# Patient Record
Sex: Female | Born: 1995 | Race: White | Hispanic: No | Smoking: Never smoker
Health system: Southern US, Community
[De-identification: ages and names within clinical notes are randomized; demographics above are authoritative.]

## PROBLEM LIST (undated history)

## (undated) ENCOUNTER — Inpatient Hospital Stay: Payer: Self-pay

## (undated) DIAGNOSIS — Z789 Other specified health status: Secondary | ICD-10-CM

## (undated) DIAGNOSIS — K589 Irritable bowel syndrome without diarrhea: Secondary | ICD-10-CM

## (undated) DIAGNOSIS — N83209 Unspecified ovarian cyst, unspecified side: Secondary | ICD-10-CM

## (undated) DIAGNOSIS — R87629 Unspecified abnormal cytological findings in specimens from vagina: Secondary | ICD-10-CM

## (undated) DIAGNOSIS — T7840XA Allergy, unspecified, initial encounter: Secondary | ICD-10-CM

## (undated) DIAGNOSIS — F32A Depression, unspecified: Secondary | ICD-10-CM

## (undated) DIAGNOSIS — F419 Anxiety disorder, unspecified: Secondary | ICD-10-CM

## (undated) HISTORY — PX: TONSILLECTOMY: SUR1361

## (undated) HISTORY — DX: Unspecified abnormal cytological findings in specimens from vagina: R87.629

## (undated) HISTORY — DX: Unspecified ovarian cyst, unspecified side: N83.209

## (undated) HISTORY — PX: ADENOIDECTOMY W/ MYRINGOTOMY: SHX1128

## (undated) HISTORY — DX: Irritable bowel syndrome, unspecified: K58.9

## (undated) HISTORY — PX: ERCP: SHX60

## (undated) HISTORY — DX: Allergy, unspecified, initial encounter: T78.40XA

## (undated) HISTORY — DX: Anxiety disorder, unspecified: F41.9

## (undated) HISTORY — DX: Other specified health status: Z78.9

## (undated) HISTORY — DX: Depression, unspecified: F32.A

---
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
## 2015-01-28 ENCOUNTER — Encounter: Payer: Self-pay | Admitting: *Deleted

## 2015-01-28 ENCOUNTER — Inpatient Hospital Stay
Admission: EM | Admit: 2015-01-28 | Discharge: 2015-01-28 | Disposition: A | Discharge status: Home / Self Care | Payer: Medicaid Other | Attending: Obstetrics & Gynecology | Admitting: Obstetrics & Gynecology

## 2015-01-28 DIAGNOSIS — O368120 Decreased fetal movements, second trimester, not applicable or unspecified: Secondary | ICD-10-CM | POA: Diagnosis present

## 2015-01-28 DIAGNOSIS — Z3A21 21 weeks gestation of pregnancy: Secondary | ICD-10-CM | POA: Insufficient documentation

## 2015-01-28 NOTE — OB Triage Note (Signed)
21week G2P1 presents with c/o decreased fetal movement

## 2015-01-28 NOTE — Final Progress Note (Signed)
Physician Final Progress Note  Patient ID: Kristen Little MRN: [id number] DOB/AGE: [birth year] 19 y.o.  Admit date: 01/28/2015 Admitting provider: Nadara Mustardobert P Waymon Laser, MD Discharge date: 01/28/2015   Admission Diagnoses: Decreased fetal movement  Discharge Diagnoses:  Active Problems:   * No active hospital problems. *    DFM  Consults: None  Significant Findings/ Diagnostic Studies: FHT 130s.   No s/sx PTL.  Discharge Condition: good  Disposition: 01-Home or Self Care  Diet: Regular diet  Discharge Activity: Activity as tolerated     Medication List    ASK your doctor about these medications        prenatal multivitamin Tabs tablet  Take 1 tablet by mouth daily at 12 noon.         Total time spent taking care of this patient: 15 minutes  Signed: Letitia LibraHARRIS,Neven Fina PAUL 01/28/2015, 5:58 PM

## 2015-01-28 NOTE — Progress Notes (Signed)
Progress Note Decreased Fetal Movement.  Subjective:   Kristen Little is a 19 y.o. female. She is at 6229w2d gestation. She has noted decreased fetal movement for the last 1 day.  She also reports normal pregnancy at [facility] but admits to no bleeding, no contractions, no cramping, no leaking and bleeding, contractions, cramping or leaking. Her pregnancy has been complicated by: none  PMH, PSH, POBH and problem list reviewed Medications and allergies reviewed.    Objective:  AF, VSS General appearance: alert, well appearing, and in no distress. External fetal monitoring: 130s.    Assessment:   Pregnancy at 6229w2d with concerns for decreased fetal movement. Evaluation reveals: Fetal Wellbeing Reassuring     Plan:    Counseled on fetal movements at 21 weeks  Orders placed: Orders Placed This Encounter  Procedures  . Discharge patient    Standing Status: Standing     Number of Occurrences: 1     Standing Expiration Date:     Patient expresses understanding of information provided and plan of care.

## 2015-01-28 NOTE — Discharge Instructions (Signed)
Follow up with the Health Department at your normal appointment.   Call your OB or come into the emergency room if you have any bleeding or leaking of fluid.

## 2015-02-14 NOTE — L&D Delivery Note (Addendum)
Delivery Note At 12:50 PM a viable female was delivered via Vaginal, Spontaneous Delivery (Presentation:OA;LOA).  APGAR: 8, 9; weight  3310 g.   Placenta status: Intact, Spontaneous.  Cord: 3 vessels with the following complications: none.  Cord pH: NA  Called to see patient. Epidural not working well and pt with strong urge to push. AROM for MSF. Lip of cervix easily reduced. Mom pushed to delivery viable female infant.  The head followed by shoulders, which delivered without difficulty, and the rest of the body.  Nuchal cord x1 noted and easily reduced on the perineum.  Baby to mom's chest with vigorous cry.  Cord clamped and cut after > 1 min delay.  No cord blood obtained.  Placenta delivered spontaneously, intact, with a 3-vessel cord.  Second degree perineal laceration repaired with 3-0 Vicryl in standard fashion.  All counts correct.  Hemostasis obtained with IV pitocin and fundal massage. EBL 300 mL.    Anesthesia: Epidural Local  Episiotomy: None Lacerations: 2nd degree Suture Repair: 3.0 vicryl Est. Blood Loss (mL):  300  Mom to postpartum.  Baby to Couplet care / Skin to Skin.  Tresea MallGLEDHILL,Lorry Furber, CNM

## 2015-05-08 ENCOUNTER — Observation Stay
Admission: EM | Admit: 2015-05-08 | Discharge: 2015-05-09 | Disposition: A | Discharge status: Home / Self Care | Payer: Medicaid Other | Attending: Obstetrics and Gynecology | Admitting: Obstetrics and Gynecology

## 2015-05-08 ENCOUNTER — Encounter: Payer: Self-pay | Admitting: *Deleted

## 2015-05-08 DIAGNOSIS — O99513 Diseases of the respiratory system complicating pregnancy, third trimester: Principal | ICD-10-CM | POA: Insufficient documentation

## 2015-05-08 DIAGNOSIS — O9989 Other specified diseases and conditions complicating pregnancy, childbirth and the puerperium: Secondary | ICD-10-CM | POA: Diagnosis present

## 2015-05-08 DIAGNOSIS — Z880 Allergy status to penicillin: Secondary | ICD-10-CM | POA: Insufficient documentation

## 2015-05-08 DIAGNOSIS — J101 Influenza due to other identified influenza virus with other respiratory manifestations: Secondary | ICD-10-CM | POA: Diagnosis not present

## 2015-05-08 DIAGNOSIS — Z881 Allergy status to other antibiotic agents status: Secondary | ICD-10-CM | POA: Insufficient documentation

## 2015-05-08 DIAGNOSIS — Z3A35 35 weeks gestation of pregnancy: Secondary | ICD-10-CM | POA: Diagnosis not present

## 2015-05-08 NOTE — OB Triage Note (Signed)
Notes aches and pain throughout body x 3-4 days, specifically back, under ribs. Notes h/a's on temples x 3 days- Tylenol  Possibly 325 mg only-does not take away. Pt states aches and pains much worse today- started a with a productive cough today with a light yellow phelgm

## 2015-05-09 DIAGNOSIS — J101 Influenza due to other identified influenza virus with other respiratory manifestations: Secondary | ICD-10-CM

## 2015-05-09 DIAGNOSIS — O99513 Diseases of the respiratory system complicating pregnancy, third trimester: Secondary | ICD-10-CM | POA: Diagnosis not present

## 2015-05-09 LAB — INFLUENZA PANEL BY PCR (TYPE A & B)
H1N1 flu by pcr: NOT DETECTED
INFLAPCR: POSITIVE — AB
INFLBPCR: NEGATIVE

## 2015-05-09 MED ORDER — OSELTAMIVIR PHOSPHATE 75 MG PO CAPS
75.0000 mg | ORAL_CAPSULE | ORAL | Status: AC
  Administered 2015-05-09: 75 mg via ORAL
  Filled 2015-05-09: qty 1

## 2015-05-09 MED ORDER — ACETAMINOPHEN 500 MG PO TABS
1000.0000 mg | ORAL_TABLET | ORAL | Status: AC
  Administered 2015-05-09: 1000 mg via ORAL
  Filled 2015-05-09: qty 2

## 2015-05-09 MED ORDER — OSELTAMIVIR PHOSPHATE 75 MG PO CAPS: 75.0000 mg | ORAL_CAPSULE | Freq: Two times a day (BID) | ORAL | Status: AC

## 2015-05-09 NOTE — Progress Notes (Signed)
Nasal swab obtained as ordered, pt tolerated procedure well.

## 2015-05-09 NOTE — Final Progress Note (Signed)
Physician Final Progress Note  Patient ID: Kristen Little C Johnson MRN: [id number] DOB/AGE: March 25, 1995 20 y.o.  Admit date: 05/08/2015 Admitting provider: Conard NovakStephen D Miliyah Luper, MD Discharge date: 05/09/2015   Admission Diagnoses:  Supervision pregnancy 3rd trimester Intrauterine pregnancy at 7366w5d   Discharge Diagnoses:  Active Problems:   Labor and delivery, indication for care  Supervision pregnancy 3rd trimester  pregnancy at 3766w5d  Influenza A   History of Present Illness: The patient is a 20 y.o. female G2P0 at 1766w5d who presents for two-three days of cough, aching, chills, nausea vomiting.  Denies fever and chills. Notes +FM, no LOF, no ctx. No vaginal bleeding   Past Medical HistoryNo past medical history on file.  Past Surgical History  Procedure Laterality Date  . Tonsillectomy    . Adenoidectomy w/ myringotomy Bilateral     done as a child    No current facility-administered medications on file prior to encounter.   Current Outpatient Prescriptions on File Prior to Encounter  Medication Sig Dispense Refill  . Prenatal Vit-Fe Fumarate-FA (PRENATAL MULTIVITAMIN) TABS tablet Take 1 tablet by mouth daily at 12 noon.      Allergies  Allergen Reactions  . Azithromycin Nausea And Vomiting  . Penicillins Other (See Comments)    States she had been noted to have allergy as a child- does not know what reaction was- has avoided taking it    Social History   Social History  . Marital Status: Single    Spouse Name: N/A  . Number of Children: N/A  . Years of Education: N/A   Occupational History  . Not on file.   Social History Main Topics  . Smoking status: Never Smoker   . Smokeless tobacco: Not on file  . Alcohol Use: No  . Drug Use: No  . Sexual Activity: Yes   Other Topics Concern  . Not on file   Social History Narrative    Physical Exam: BP 115/62 mmHg  Pulse 105  Temp(Src) 98.7 F (37.1 C) (Oral)  Resp 20  Ht 5\' 4"  (1.626 m)  Wt 99.791 kg (220 lb)   BMI 37.74 kg/m2  SpO2 97%  Gen: NAD CV: RRR Pulm: CTAB Pelvic: Deferred Ext: no e/c/t  Consults: None  Significant Findings/ Diagnostic Studies:  Influenza A positive  Procedures:  NST Baseline: 120 Variability: moderate Accelerations: present Decelerations; absent Toco: minimal  Discharge Condition: stable  Disposition: 01-Home or Self Care  Diet: Regular diet  Discharge Activity: Activity as tolerated     Medication List    TAKE these medications        aspirin 81 MG tablet  Take 81 mg by mouth daily.     oseltamivir 75 MG capsule  Commonly known as:  TAMIFLU  Take 1 capsule (75 mg total) by mouth 2 (two) times daily.     prenatal multivitamin Tabs tablet  Take 1 tablet by mouth daily at 12 noon.         Total time spent taking care of this patient: 30 minutes  Signed: Conard NovakJackson, Ryer Asato D, MD  05/09/2015, 2:53 AM

## 2015-05-09 NOTE — Progress Notes (Signed)
Pt left department walking with spouse, belongings, daughter, d/c instructions- reviewed, and prescription.

## 2015-06-10 ENCOUNTER — Inpatient Hospital Stay
Admission: EM | Admit: 2015-06-10 | Discharge: 2015-06-12 | DRG: 774 | Disposition: A | Discharge status: Home / Self Care | Payer: Medicaid Other | Attending: Obstetrics & Gynecology | Admitting: Obstetrics & Gynecology

## 2015-06-10 ENCOUNTER — Inpatient Hospital Stay: Payer: Medicaid Other | Admitting: Anesthesiology

## 2015-06-10 ENCOUNTER — Encounter: Payer: Self-pay | Admitting: Anesthesiology

## 2015-06-10 DIAGNOSIS — E669 Obesity, unspecified: Secondary | ICD-10-CM | POA: Diagnosis present

## 2015-06-10 DIAGNOSIS — Z833 Family history of diabetes mellitus: Secondary | ICD-10-CM

## 2015-06-10 DIAGNOSIS — O1092 Unspecified pre-existing hypertension complicating childbirth: Principal | ICD-10-CM | POA: Diagnosis present

## 2015-06-10 DIAGNOSIS — Z3A40 40 weeks gestation of pregnancy: Secondary | ICD-10-CM | POA: Diagnosis not present

## 2015-06-10 DIAGNOSIS — Z6838 Body mass index (BMI) 38.0-38.9, adult: Secondary | ICD-10-CM

## 2015-06-10 DIAGNOSIS — Z809 Family history of malignant neoplasm, unspecified: Secondary | ICD-10-CM

## 2015-06-10 DIAGNOSIS — Z880 Allergy status to penicillin: Secondary | ICD-10-CM | POA: Diagnosis not present

## 2015-06-10 DIAGNOSIS — Z881 Allergy status to other antibiotic agents status: Secondary | ICD-10-CM | POA: Diagnosis not present

## 2015-06-10 DIAGNOSIS — J101 Influenza due to other identified influenza virus with other respiratory manifestations: Secondary | ICD-10-CM

## 2015-06-10 DIAGNOSIS — O99214 Obesity complicating childbirth: Secondary | ICD-10-CM | POA: Diagnosis present

## 2015-06-10 DIAGNOSIS — Z8249 Family history of ischemic heart disease and other diseases of the circulatory system: Secondary | ICD-10-CM

## 2015-06-10 DIAGNOSIS — O480 Post-term pregnancy: Secondary | ICD-10-CM | POA: Diagnosis present

## 2015-06-10 DIAGNOSIS — O99824 Streptococcus B carrier state complicating childbirth: Secondary | ICD-10-CM | POA: Diagnosis present

## 2015-06-10 DIAGNOSIS — O10913 Unspecified pre-existing hypertension complicating pregnancy, third trimester: Secondary | ICD-10-CM | POA: Diagnosis present

## 2015-06-10 LAB — CBC
HEMATOCRIT: 38 % (ref 35.0–47.0)
Hemoglobin: 12.8 g/dL (ref 12.0–16.0)
MCH: 28.8 pg (ref 26.0–34.0)
MCHC: 33.8 g/dL (ref 32.0–36.0)
MCV: 85.3 fL (ref 80.0–100.0)
PLATELETS: 186 10*3/uL (ref 150–440)
RBC: 4.46 MIL/uL (ref 3.80–5.20)
RDW: 15.2 % — AB (ref 11.5–14.5)
WBC: 12.4 10*3/uL — AB (ref 3.6–11.0)

## 2015-06-10 LAB — COMPREHENSIVE METABOLIC PANEL
ALBUMIN: 3.5 g/dL (ref 3.5–5.0)
ALT: 10 U/L — ABNORMAL LOW (ref 14–54)
ANION GAP: 11 (ref 5–15)
AST: 17 U/L (ref 15–41)
Alkaline Phosphatase: 171 U/L — ABNORMAL HIGH (ref 38–126)
BUN: 12 mg/dL (ref 6–20)
CO2: 20 mmol/L — AB (ref 22–32)
Calcium: 9.1 mg/dL (ref 8.9–10.3)
Chloride: 104 mmol/L (ref 101–111)
Creatinine, Ser: 0.72 mg/dL (ref 0.44–1.00)
GFR calc Af Amer: >60 mL/min (ref >60)
GFR calc non Af Amer: >60 mL/min (ref >60)
GLUCOSE: 97 mg/dL (ref 65–99)
POTASSIUM: 4.2 mmol/L (ref 3.5–5.1)
SODIUM: 135 mmol/L (ref 135–145)
Total Bilirubin: 0.6 mg/dL (ref 0.3–1.2)
Total Protein: 7 g/dL (ref 6.5–8.1)

## 2015-06-10 LAB — ABO/RH: ABO/RH(D): B POS

## 2015-06-10 LAB — TYPE AND SCREEN
ABO/RH(D): B POS
ANTIBODY SCREEN: NEGATIVE

## 2015-06-10 MED ORDER — FENTANYL 2.5 MCG/ML W/ROPIVACAINE 0.2% IN NS 100 ML EPIDURAL INFUSION (ARMC-ANES)
10.0000 mL/h | EPIDURAL | Status: DC
  Filled 2015-06-10: qty 100

## 2015-06-10 MED ORDER — SODIUM CHLORIDE 0.9 % IV SOLN
INTRAVENOUS | Status: AC
  Administered 2015-06-10: 1 g via INTRAVENOUS
  Filled 2015-06-10: qty 1000

## 2015-06-10 MED ORDER — EPHEDRINE 5 MG/ML INJ
10.0000 mg | INTRAVENOUS | Status: DC | PRN
  Filled 2015-06-10: qty 2

## 2015-06-10 MED ORDER — BUTORPHANOL TARTRATE 1 MG/ML IJ SOLN
1.0000 mg | INTRAMUSCULAR | Status: DC | PRN
  Administered 2015-06-10 (×2): 1 mg via INTRAVENOUS
  Filled 2015-06-10 (×2): qty 1

## 2015-06-10 MED ORDER — LACTATED RINGERS IV SOLN: 500.0000 mL | INTRAVENOUS | Status: DC | PRN

## 2015-06-10 MED ORDER — TETANUS-DIPHTH-ACELL PERTUSSIS 5-2.5-18.5 LF-MCG/0.5 IM SUSP
0.5000 mL | Freq: Once | INTRAMUSCULAR | Status: DC
  Filled 2015-06-10: qty 0.5

## 2015-06-10 MED ORDER — PRENATAL MULTIVITAMIN CH
1.0000 | ORAL_TABLET | Freq: Every day | ORAL | Status: DC
  Administered 2015-06-11 – 2015-06-12 (×2): 1 via ORAL
  Filled 2015-06-10 (×2): qty 1

## 2015-06-10 MED ORDER — ONDANSETRON HCL 4 MG/2ML IJ SOLN: 4.0000 mg | INTRAMUSCULAR | Status: DC | PRN

## 2015-06-10 MED ORDER — MISOPROSTOL 200 MCG PO TABS
ORAL_TABLET | ORAL | Status: AC
  Filled 2015-06-10: qty 4

## 2015-06-10 MED ORDER — ONDANSETRON HCL 4 MG PO TABS
4.0000 mg | ORAL_TABLET | ORAL | Status: DC | PRN
  Administered 2015-06-12: 4 mg via ORAL
  Filled 2015-06-10: qty 1

## 2015-06-10 MED ORDER — OXYCODONE HCL 5 MG PO TABS
10.0000 mg | ORAL_TABLET | ORAL | Status: DC | PRN
Start: 2015-06-10 — End: 2015-06-12
  Administered 2015-06-10: 10 mg via ORAL
  Filled 2015-06-10 (×3): qty 2

## 2015-06-10 MED ORDER — DIPHENHYDRAMINE HCL 25 MG PO CAPS: 25.0000 mg | ORAL_CAPSULE | Freq: Four times a day (QID) | ORAL | Status: DC | PRN

## 2015-06-10 MED ORDER — LACTATED RINGERS IV SOLN
500.0000 mL | Freq: Once | INTRAVENOUS | Status: DC
Start: 2015-06-10 — End: 2015-06-10

## 2015-06-10 MED ORDER — ACETAMINOPHEN 325 MG PO TABS: 650.0000 mg | ORAL_TABLET | ORAL | Status: DC | PRN

## 2015-06-10 MED ORDER — ONDANSETRON HCL 4 MG/2ML IJ SOLN: 4.0000 mg | Freq: Four times a day (QID) | INTRAMUSCULAR | Status: DC | PRN

## 2015-06-10 MED ORDER — COCONUT OIL OIL
1.0000 "application " | TOPICAL_OIL | Status: DC | PRN
  Filled 2015-06-10: qty 120

## 2015-06-10 MED ORDER — OXYTOCIN 10 UNIT/ML IJ SOLN
INTRAMUSCULAR | Status: AC
  Filled 2015-06-10: qty 2

## 2015-06-10 MED ORDER — LACTATED RINGERS IV SOLN
INTRAVENOUS | Status: DC
  Administered 2015-06-10: 125 mL/h via INTRAVENOUS

## 2015-06-10 MED ORDER — OXYTOCIN BOLUS FROM INFUSION: 500.0000 mL | INTRAVENOUS | Status: DC

## 2015-06-10 MED ORDER — SODIUM CHLORIDE 0.9 % IV SOLN
INTRAVENOUS | Status: AC
  Administered 2015-06-10: 2 g via INTRAVENOUS
  Filled 2015-06-10: qty 2000

## 2015-06-10 MED ORDER — LIDOCAINE HCL (PF) 1 % IJ SOLN: 30.0000 mL | INTRAMUSCULAR | Status: DC | PRN

## 2015-06-10 MED ORDER — SODIUM CHLORIDE 0.9 % IV SOLN
2.0000 g | Freq: Once | INTRAVENOUS | Status: AC
  Administered 2015-06-10: 2 g via INTRAVENOUS

## 2015-06-10 MED ORDER — AMPICILLIN SODIUM 1 G IJ SOLR
1.0000 g | Freq: Once | INTRAMUSCULAR | Status: DC
  Administered 2015-06-10: 1 g via INTRAVENOUS

## 2015-06-10 MED ORDER — PHENYLEPHRINE 40 MCG/ML (10ML) SYRINGE FOR IV PUSH (FOR BLOOD PRESSURE SUPPORT)
80.0000 ug | PREFILLED_SYRINGE | INTRAVENOUS | Status: DC | PRN
  Filled 2015-06-10: qty 5

## 2015-06-10 MED ORDER — OXYCODONE HCL 5 MG PO TABS
5.0000 mg | ORAL_TABLET | ORAL | Status: DC | PRN
Start: 2015-06-10 — End: 2015-06-12
  Administered 2015-06-10 – 2015-06-12 (×5): 5 mg via ORAL
  Filled 2015-06-10 (×3): qty 1

## 2015-06-10 MED ORDER — SIMETHICONE 80 MG PO CHEW: 80.0000 mg | CHEWABLE_TABLET | ORAL | Status: DC | PRN

## 2015-06-10 MED ORDER — IBUPROFEN 600 MG PO TABS
ORAL_TABLET | ORAL | Status: AC
  Administered 2015-06-10: 600 mg via ORAL
  Filled 2015-06-10: qty 1

## 2015-06-10 MED ORDER — SENNOSIDES-DOCUSATE SODIUM 8.6-50 MG PO TABS
2.0000 | ORAL_TABLET | ORAL | Status: DC
  Filled 2015-06-10: qty 2

## 2015-06-10 MED ORDER — AMMONIA AROMATIC IN INHA
RESPIRATORY_TRACT | Status: AC
  Filled 2015-06-10: qty 10

## 2015-06-10 MED ORDER — IBUPROFEN 600 MG PO TABS
600.0000 mg | ORAL_TABLET | Freq: Four times a day (QID) | ORAL | Status: DC
  Administered 2015-06-10 – 2015-06-12 (×8): 600 mg via ORAL
  Filled 2015-06-10 (×7): qty 1

## 2015-06-10 MED ORDER — BENZOCAINE-MENTHOL 20-0.5 % EX AERO
1.0000 "application " | INHALATION_SPRAY | CUTANEOUS | Status: DC | PRN
  Administered 2015-06-10: 1 via TOPICAL
  Filled 2015-06-10: qty 56

## 2015-06-10 MED ORDER — DIBUCAINE 1 % RE OINT: 1.0000 "application " | TOPICAL_OINTMENT | RECTAL | Status: DC | PRN

## 2015-06-10 MED ORDER — WITCH HAZEL-GLYCERIN EX PADS: 1.0000 "application " | MEDICATED_PAD | CUTANEOUS | Status: DC | PRN

## 2015-06-10 MED ORDER — OXYTOCIN 40 UNITS IN LACTATED RINGERS INFUSION - SIMPLE MED
2.5000 [IU]/h | INTRAVENOUS | Status: DC
  Administered 2015-06-10: 2.5 [IU]/h via INTRAVENOUS
  Filled 2015-06-10: qty 1000

## 2015-06-10 MED ORDER — LIDOCAINE HCL (PF) 1 % IJ SOLN
INTRAMUSCULAR | Status: AC
  Filled 2015-06-10: qty 30

## 2015-06-10 NOTE — H&P (Signed)
OB History & Physical   History of Present Illness:  Chief Complaint: labor contractions at term  HPI:  Kristen Little is a 20 y.o. G2P0 female at 4038w2d dated by U/S.  Her pregnancy has been complicated by proteinuria, gbs positive, influenza A, elevated blood pressure without diagnosis of hypertension- probably CHTN per health dept records.    She reports contractions.   She denies leakage of fluid.   She denies vaginal bleeding.   She reports fetal movement.    Maternal Medical History:  No past medical history on file.  Past Surgical History  Procedure Laterality Date  . Tonsillectomy    . Adenoidectomy w/ myringotomy Bilateral     done as a child    Allergies  Allergen Reactions  . Azithromycin Nausea And Vomiting  . Penicillins Other (See Comments)    States she had been noted to have allergy as a child- does not know what reaction was- has avoided taking it    Prior to Admission medications   Medication Sig Start Date End Date Taking? Authorizing Provider  aspirin 81 MG tablet Take 81 mg by mouth daily.    Historical Provider, MD  Prenatal Vit-Fe Fumarate-FA (PRENATAL MULTIVITAMIN) TABS tablet Take 1 tablet by mouth daily at 12 noon.    Historical Provider, MD    OB History  Gravida Para Term Preterm AB SAB TAB Ectopic Multiple Living  2         1    # Outcome Date GA Lbr Len/2nd Weight Sex Delivery Anes PTL Lv  2 Current           1 Gravida 04/10/11    F Vag-Spont  N Y    Obstetric Comments  Observed for high BP- resolved, Vaginal bleeding- query post v/e, decreased Fetal movement- resolved, right side Ovarian cyst- resolved    Prenatal care site: [facility]  Social History: She  reports that she has never smoked. She does not have any smokeless tobacco history on file. She reports that she does not drink alcohol or use illicit drugs.  Family History: family history includes Cancer in her maternal grandfather and maternal grandmother; Diabetes in her maternal  grandmother; Heart disease in her maternal grandmother; Hepatitis C in her paternal grandmother.   Review of Systems: Negative x 10 systems reviewed except as noted in the HPI.    Physical Exam:  Vital Signs: There were no vitals taken for this visit. General: no acute distress.  HEENT: normocephalic, atraumatic Heart: regular rate & rhythm.  No murmurs/rubs/gallops Lungs: clear to auscultation bilaterally Abdomen: soft, gravid, non-tender;  EFW: 7.5 pounds Pelvic:   External: Normal external female genitalia  Cervix:  4 / 80  /  -1   Extremities: non-tender, symmetric, trace edema bilaterally.  DTRs: 2+ bilaterally  Neurologic: Alert & oriented x 3.    Pertinent Results:  Prenatal Labs: Blood type/Rh B positive  Antibody screen negative  Rubella Immune  Varicella Immune    RPR Non Reactive  HBsAg negative  HIV negative  GC negative  Chlamydia negative  Genetic screening declined  1 hour GTT Early 137  3 hour GTT Early 78, 132,440,102166,136,115. 28 wk 71, 156,157, 103  GBS positive on 05/20/2015   Baseline FHR: 135 beats/min   Variability: moderate   Accelerations: present   Decelerations: absent Contractions: present frequency: every 2-3 minutes Overall assessment: Category I    Assessment:  Kristen Little is a 20 y.o. G2P0 female at 7438w2d with active labor  at term.   Plan:  1. Admit to Labor & Delivery  2. CBC, CMP, T&S, Clrs, IVF 3. GBS positive.  Prophylaxis with ampicillin 4. Fetal well-being: Category I 5. IV pain med if not contraindicated with stage of labor   Malaiya Paczkowski, CNM

## 2015-06-10 NOTE — Anesthesia Preprocedure Evaluation (Signed)
Anesthesia Evaluation  Patient identified by MRN, date of birth, ID band Patient awake    Reviewed: Allergy & Precautions, NPO status , Patient's Chart, lab work & pertinent test results, reviewed documented beta blocker date and time   Airway Mallampati: II  TM Distance: >3 FB     Dental  (+) Chipped   Pulmonary           Cardiovascular      Neuro/Psych    GI/Hepatic   Endo/Other    Renal/GU      Musculoskeletal   Abdominal   Peds  Hematology   Anesthesia Other Findings Obese.  Reproductive/Obstetrics                             Anesthesia Physical Anesthesia Plan  ASA: III  Anesthesia Plan: Epidural   Post-op Pain Management:    Induction:   Airway Management Planned:   Additional Equipment:   Intra-op Plan:   Post-operative Plan:   Informed Consent: I have reviewed the patients History and Physical, chart, labs and discussed the procedure including the risks, benefits and alternatives for the proposed anesthesia with the patient or authorized representative who has indicated his/her understanding and acceptance.     Plan Discussed with: CRNA  Anesthesia Plan Comments:         Anesthesia Quick Evaluation

## 2015-06-10 NOTE — Discharge Summary (Signed)
OB Discharge Summary  Patient Name: Kristen Little DOB: 1995-07-17 MRN: [id number]  Date of admission: 06/10/2015 Delivering MD: Tresea Mall, CNM Date of Delivery: 06/10/2015  Date of discharge: 06/12/15  Admitting diagnosis: Active labor at term Intrauterine pregnancy: [redacted]w[redacted]d     Secondary diagnosis: Chronic Hypertension     Discharge diagnosis: Term Pregnancy Delivered                                                                                                Post partum procedures:none  Augmentation: none  Complications: None  Hospital course:  Onset of Labor With Vaginal Delivery     20 y.o. yo G2P0 at [redacted]w[redacted]d was admitted in Active Labor on 06/10/2015. Patient had an uncomplicated labor course as follows:  Membrane Rupture Time/Date: 12:37 PM ,06/10/2015   Intrapartum Procedures:  Episiotomy: None  Lacerations:  2nd degree  Patient had a delivery of a Viable infant 06/10/2015.  Details of delivery can be found in a separate note.    Pateint had an uncomplicated postpartum course.  She is ambulating, tolerating a regular diet, passing flatus, and urinating well. Patient is discharged home in stable condition on PPD #2.    Physical exam  Filed Vitals:   06/10/15 1239 06/10/15 1254 06/10/15 1300 06/10/15 1315  BP: 116/64 98/60 129/63 130/55  Pulse: 114 131 113 107  Temp:      TempSrc:      Height:      Weight:      SpO2:       General: alert, cooperative and no distress Lochia: appropriate Uterine Fundus: firm Incision: N/A DVT Evaluation: No evidence of DVT seen on physical exam.  Labs: Lab Results  Component Value Date   WBC 12.4* 06/10/2015   HGB 12.8 06/10/2015   HCT 38.0 06/10/2015   MCV 85.3 06/10/2015   PLT 186 06/10/2015   CMP Latest Ref Rng 06/10/2015  Glucose 65 - 99 mg/dL 97  BUN 6 - 20 mg/dL 12  Creatinine 4.09 - 8.11 mg/dL 9.14  Sodium 782 - 956 mmol/L 135  Potassium 3.5 - 5.1 mmol/L 4.2  Chloride 101 - 111 mmol/L 104  CO2 22 - 32  mmol/L 20(L)  Calcium 8.9 - 10.3 mg/dL 9.1  Total Protein 6.5 - 8.1 g/dL 7.0  Total Bilirubin 0.3 - 1.2 mg/dL 0.6  Alkaline Phos 38 - 126 U/L 171(H)  AST 15 - 41 U/L 17  ALT 14 - 54 U/L 10(L)   PP HCT: 31.3  Discharge instruction: per After Visit Summary.  Medications:    Medication List    STOP taking these medications        aspirin 81 MG tablet      TAKE these medications        ibuprofen 600 MG tablet  Commonly known as:  ADVIL,MOTRIN  Take 1 tablet (600 mg total) by mouth every 6 (six) hours.     norethindrone 0.35 MG tablet  Commonly known as:  ORTHO MICRONOR  Take 1 tablet (0.35 mg total) by mouth daily.     oxyCODONE 5 MG immediate release tablet  Commonly known as:  Oxy IR/ROXICODONE  Take 1 tablet (5 mg total) by mouth every 4 (four) hours as needed (pain scale 4-7).     prenatal multivitamin Tabs tablet  Take 1 tablet by mouth daily at 12 noon.         Diet: routine diet  Activity: Advance as tolerated. Pelvic rest for 6 weeks.   Outpatient follow up: Follow-up Information    [facility]. Schedule an appointment as soon as possible for a visit in 6 weeks.   Specialty:  Obstetrics   Why:  Postpartum Follow-up   Contact information:   [address] [place] [address] [phone]      Postpartum contraception: Progesterone only pills Rhogam Given postpartum: NA Rubella vaccine given postpartum: Rubella Immune Varicella vaccine given postpartum: Varicella Immune TDaP given antepartum or postpartum: TDaP given antepartum per pt report   Newborn Data: Live born female  Birth Weight: 3310 g APGAR: 8,9    Baby Feeding: Bottle and Breast  Disposition:home with mother

## 2015-06-10 NOTE — Plan of Care (Signed)
JG CNM informed per Carollee HerterShannon RN that according to pts records she has an allergy to not only zithromycin but also to PCN.  When patient questioned about this, she states she was told as a child this but did not mention it at all this morning when C Ammaar Encina RNC was asking her the admission questions which included her allergies. She had only said zithromycin.  Pt was given a dose of Amp 2 gram at 0837 this am as ordered per CNM for GBS +. She has had no reaction to the medication at this time.  CNM JG now aware.  Ellison Carwin Shawnell Dykes RNC

## 2015-06-10 NOTE — OB Triage Note (Signed)
Pt arrived to OBS rm 4 with c/o contractions every 3 mins starting at 0100 04/12/2015. No gush of fluids or bleeding. Pt placed on monitors and Oriented to room

## 2015-06-10 NOTE — Anesthesia Procedure Notes (Signed)
Epidural Patient location during procedure: OB  Staffing Anesthesiologist: Berdine AddisonHOMAS, Kristen Little Performed by: anesthesiologist   Preanesthetic Checklist Completed: patient identified, site marked, surgical consent, pre-op evaluation, timeout performed, IV checked, risks and benefits discussed and monitors and equipment checked  Epidural Patient position: sitting Prep: Betadine Patient monitoring: heart rate, continuous pulse ox and blood pressure Approach: midline Location: L4-L5 Injection technique: LOR saline  Needle:  Needle type: Tuohy  Needle gauge: 18 G Needle length: 9 cm and 9 Catheter type: closed end flexible Catheter size: 20 Guage Test dose: negative and 1.5% lidocaine with Epi 1:200 K  Assessment Sensory level: T10 Events: blood not aspirated, injection not painful, no injection resistance, negative IV test and no paresthesia  Additional Notes   Patient tolerated the insertion well without complications. 1120 Kristen Little starts. Difficult epidural I started trying at 1200. At  1207 5 ml of .25% marcaine. Infusion started at 1212.Reason for block:procedure for pain

## 2015-06-10 NOTE — Plan of Care (Signed)
[name] patient [facility] to [facility]BS 4 with complaint of contractions/labor since 1 am today. UC's per pt are approx Q # mins apart.  Pt denies bleeding, leaking fluid. States active fetal movement noted all morning.  Pt has allergy to zithromycin, 0 Falls with pregnancy. FOB Anibel Montalvo at bedside. Pt does not want to be on directory. Password is BET.  Pt having [name].  Ellison Carwin Adelfa Lozito RNC

## 2015-06-11 LAB — CBC
HCT: 31.3 % — ABNORMAL LOW (ref 35.0–47.0)
HEMOGLOBIN: 10.5 g/dL — AB (ref 12.0–16.0)
MCH: 29.3 pg (ref 26.0–34.0)
MCHC: 33.7 g/dL (ref 32.0–36.0)
MCV: 87 fL (ref 80.0–100.0)
Platelets: 186 10*3/uL (ref 150–440)
RBC: 3.6 MIL/uL — AB (ref 3.80–5.20)
RDW: 15.5 % — ABNORMAL HIGH (ref 11.5–14.5)
WBC: 14.5 10*3/uL — AB (ref 3.6–11.0)

## 2015-06-11 LAB — RPR: RPR Ser Ql: NONREACTIVE

## 2015-06-11 NOTE — Discharge Instructions (Signed)
Call your doctor for increased pain or vaginal bleeding, temperature above 100.4, depression, or concerns.  No strenuous activity or heavy lifting for 6 weeks.  No intercourse, tampons, douching, or enemas for 6 weeks.  No tub baths-showers only.  No driving for 2 weeks or while taking pain medications.  Continue prenatal vitamin and iron.  Increase calories and fluids while breastfeeding. °

## 2015-06-11 NOTE — Progress Notes (Signed)
Post Partum Day 1  Subjective: Doing well, no complaints.  Tolerating regular diet, pain with PO meds, voiding and ambulating without difficulty.  No CP SOB F/C N/V or leg pain No HA, change of vision, RUQ/epigastric pain  Objective: BP 107/60 mmHg  Pulse 87  Temp(Src) 98 F (36.7 C) (Oral)  Resp 20  Ht 5\' 4"  (1.626 m)  Wt 102.513 kg (226 lb)  BMI 38.77 kg/m2  SpO2 100%  breast?  Physical Exam:  General: NAD CV: RRR Pulm: nl effort, CTABL Lochia: moderate Uterine Fundus: fundus firm and below umbilicus DVT Evaluation: no cords, ttp LEs    Recent Labs  06/10/15 0847 06/11/15 0608  HGB 12.8 10.5*  HCT 38.0 31.3*  WBC 12.4* 14.5*  PLT 186 186    Assessment/Plan: 20 y.o. G2P1001 postpartum day # 1  1. Continue routine postpartum care    Kristen Little, CNM

## 2015-06-11 NOTE — Anesthesia Postprocedure Evaluation (Signed)
Anesthesia Post Note  Patient: Kristen Little  Procedure(s) Performed: * No procedures listed *  Patient location during evaluation: Mother Baby Anesthesia Type: Epidural Level of consciousness: awake, awake and alert and oriented Pain management: pain level not controlled Vital Signs Assessment: post-procedure vital signs reviewed and stable Respiratory status: spontaneous breathing, nonlabored ventilation and respiratory function stable Cardiovascular status: blood pressure returned to baseline and stable Postop Assessment: no headache, patient able to bend at knees, no signs of nausea or vomiting and adequate PO intake Anesthetic complications: no Comments: Patient states she did not get any relief. Complains of back pain. Skin intact  Egg size bruise noted in the area of multiple attempts.    Last Vitals:  Filed Vitals:   06/11/15 0402 06/11/15 0407  BP: 129/66 120/74  Pulse: 82 95  Temp: 36.8 C 37 C  Resp: 18 18    Last Pain:  Filed Vitals:   06/11/15 0622  PainSc: 5                  Chiropodisttephanie Josiah Nieto

## 2015-06-12 LAB — CHLAMYDIA/NGC RT PCR (ARMC ONLY)
Chlamydia Tr: NOT DETECTED
N gonorrhoeae: NOT DETECTED

## 2015-06-12 MED ORDER — NORETHINDRONE 0.35 MG PO TABS: 1.0000 | ORAL_TABLET | Freq: Every day | ORAL | Status: DC

## 2015-06-12 MED ORDER — OXYCODONE HCL 5 MG PO TABS: 5.0000 mg | ORAL_TABLET | ORAL | Status: DC | PRN

## 2015-06-12 MED ORDER — IBUPROFEN 600 MG PO TABS: 600.0000 mg | ORAL_TABLET | Freq: Four times a day (QID) | ORAL | Status: DC

## 2015-06-12 NOTE — Progress Notes (Signed)
Discharge instructions provided.  Pt and sig other verbalize understanding of all instructions and follow-up care.  Prescriptions given. [facility]. Reynold BowenSusan Paisley Naraya Stoneberg, RN 06/12/2015 3:32 PM

## 2015-06-12 NOTE — Progress Notes (Signed)
Prenatal records indicate that pt received TDaP vaccine on 03/17/15 and Influenza vaccine on 11/11/14. Reynold BowenSusan Paisley Joandry Slagter, RN 06/12/2015 9:05 AM

## 2015-07-21 ENCOUNTER — Emergency Department: Payer: Medicaid Other

## 2015-07-21 ENCOUNTER — Emergency Department
Admission: EM | Admit: 2015-07-21 | Discharge: 2015-07-21 | Disposition: A | Discharge status: Home / Self Care | Payer: Medicaid Other | Attending: Emergency Medicine | Admitting: Emergency Medicine

## 2015-07-21 DIAGNOSIS — Z880 Allergy status to penicillin: Secondary | ICD-10-CM | POA: Insufficient documentation

## 2015-07-21 DIAGNOSIS — R51 Headache: Secondary | ICD-10-CM | POA: Diagnosis present

## 2015-07-21 DIAGNOSIS — R519 Headache, unspecified: Secondary | ICD-10-CM

## 2015-07-21 MED ORDER — METOCLOPRAMIDE HCL 10 MG PO TABS
10.0000 mg | ORAL_TABLET | Freq: Once | ORAL | Status: AC
Start: 2015-07-21 — End: 2015-07-21
  Administered 2015-07-21: 10 mg via ORAL
  Filled 2015-07-21: qty 1

## 2015-07-21 MED ORDER — DIPHENHYDRAMINE HCL 25 MG PO CAPS
25.0000 mg | ORAL_CAPSULE | Freq: Once | ORAL | Status: AC
Start: 2015-07-21 — End: 2015-07-21
  Administered 2015-07-21: 25 mg via ORAL
  Filled 2015-07-21: qty 1

## 2015-07-21 MED ORDER — IBUPROFEN 800 MG PO TABS
800.0000 mg | ORAL_TABLET | Freq: Once | ORAL | Status: AC
  Administered 2015-07-21: 800 mg via ORAL
  Filled 2015-07-21: qty 1

## 2015-07-21 MED ORDER — BUTALBITAL-APAP-CAFFEINE 50-325-40 MG PO TABS: 1.0000 | ORAL_TABLET | Freq: Four times a day (QID) | ORAL | Status: AC | PRN

## 2015-07-21 NOTE — ED Notes (Signed)
Pt arrives to ER via POV c/o headache and back pain since epidural on 06/10/15. [facility] not [facility] provider today, sent to ER. Pt alert and oriented X4, active, cooperative, pt in NAD. RR even and unlabored, color WNL.

## 2015-07-21 NOTE — Discharge Instructions (Signed)

## 2015-07-21 NOTE — ED Provider Notes (Addendum)
[facility] Emergency Department Provider Note        Time seen: ----------------------------------------- 5:06 PM on 07/21/2015 -----------------------------------------    I have reviewed the triage vital signs and the nursing notes.   HISTORY  Chief Complaint Headache    HPI Kristen Little is a 20 y.o. female who presents to ER for some headache and back pain since an epidural in April. Patient states she gave birth in April, had an epidural at that time. She states she's had a headache every day. She was seen at [facility] side and cannot see a provider today so she sent to ER. She states every day her whole head hurts, nothing seems to make it better or worse.   History reviewed. No pertinent past medical history.  Patient Active Problem List   Diagnosis Date Noted  . Labor and delivery, indication for care 05/09/2015  . Influenza A 05/09/2015    Past Surgical History  Procedure Laterality Date  . Tonsillectomy    . Adenoidectomy w/ myringotomy Bilateral     done as a child    Allergies Azithromycin and Penicillins  Social History Social History  Substance Use Topics  . Smoking status: Never Smoker   . Smokeless tobacco: None  . Alcohol Use: No    Review of Systems Constitutional: Negative for fever. Eyes: Negative for visual changes. Cardiovascular: Negative for chest pain. Respiratory: Negative for shortness of breath. Gastrointestinal: Negative for abdominal pain, vomiting and diarrhea. Genitourinary: Negative for dysuria. Musculoskeletal: Negative for back pain. Skin: Negative for rash. Neurological: Positive for headache  10-point ROS otherwise negative.  ____________________________________________   PHYSICAL EXAM:  VITAL SIGNS: ED Triage Vitals  Enc Vitals Group     BP 07/21/15 1537 153/77 mmHg     Pulse Rate 07/21/15 1537 85     Resp 07/21/15 1537 20     Temp 07/21/15 1537 98 F (36.7 C)     Temp Source  07/21/15 1537 Oral     SpO2 07/21/15 1537 98 %     Weight 07/21/15 1537 204 lb (92.534 kg)     Height 07/21/15 1537  (1.626 m)     Head Cir --      Peak Flow --      Pain Score 07/21/15 1538 6     Pain Loc --      Pain Edu? --      Excl. in GC? --    Constitutional: Alert and oriented. Well appearing and in no distress. Eyes: Conjunctivae are normal. PERRL. Normal extraocular movements.Funduscopic exam is normal, no photophobia ENT   Head: Normocephalic and atraumatic.   Nose: No congestion/rhinnorhea.   Mouth/Throat: Mucous membranes are moist.   Neck: No stridor. Cardiovascular: Normal rate, regular rhythm. No murmurs, rubs, or gallops. Respiratory: Normal respiratory effort without tachypnea nor retractions. Breath sounds are clear and equal bilaterally. No wheezes/rales/rhonchi. Musculoskeletal: Nontender with normal range of motion in all extremities. No lower extremity tenderness nor edema. Neurologic:  Normal speech and language. No gross focal neurologic deficits are appreciated. Cerebellar function is normal Skin:  Skin is warm, dry and intact. No rash noted. Psychiatric: Mood and affect are normal. Speech and behavior are normal.  ___________________________________________  ED COURSE:  Pertinent labs & imaging results that were available during my care of the patient were reviewed by me and considered in my medical decision making (see chart for details). Patient is in no acute distress, this is likely not-spinal headache related.  CT  of the head here is unremarkable. ____________________________________________  FINAL ASSESSMENT AND PLAN  Headache  Plan: Patient with chronic headaches, this does not appear to be related to her previous epidural. She has been given oral headache medicine as she has declined IV access in the ER. I will refer her to neurology for outpatient follow-up.   Cheron FilbertWilliams, Jonathan E, MD   Note: This dictation was prepared  with Dragon dictation. Any transcriptional errors that result from this process are unintentional   Athalia FilbertJonathan E Williams, MD 07/21/15 1709  Adysson FilbertJonathan E Williams, MD 07/21/15 [phone]

## 2015-07-21 NOTE — ED Notes (Signed)
Pt c/o headache since she gave birth to baby x6wks ago, states also her back is hurting. Pt denies blurred vision or sensitivity to light. Pt denies n/v

## 2015-07-21 NOTE — ED Notes (Signed)
No protocol to fit sx. Pt states she just had bloodwork done yesterday.

## 2015-10-27 ENCOUNTER — Other Ambulatory Visit: Payer: Self-pay | Admitting: Nurse Practitioner

## 2015-10-27 DIAGNOSIS — R0989 Other specified symptoms and signs involving the circulatory and respiratory systems: Secondary | ICD-10-CM

## 2015-11-03 ENCOUNTER — Ambulatory Visit: Payer: Medicaid Other

## 2015-11-10 ENCOUNTER — Ambulatory Visit
Admission: RE | Admit: 2015-11-10 | Discharge: 2015-11-10 | Disposition: A | Discharge status: Home / Self Care | Payer: Medicaid Other | Source: Ambulatory Visit | Attending: Nurse Practitioner | Admitting: Nurse Practitioner

## 2015-11-10 DIAGNOSIS — K449 Diaphragmatic hernia without obstruction or gangrene: Secondary | ICD-10-CM | POA: Insufficient documentation

## 2015-11-10 DIAGNOSIS — R0989 Other specified symptoms and signs involving the circulatory and respiratory systems: Secondary | ICD-10-CM | POA: Insufficient documentation

## 2015-11-10 DIAGNOSIS — K219 Gastro-esophageal reflux disease without esophagitis: Secondary | ICD-10-CM | POA: Diagnosis not present

## 2017-04-25 ENCOUNTER — Emergency Department
Admission: EM | Admit: 2017-04-25 | Discharge: 2017-04-25 | Disposition: A | Discharge status: Home / Self Care | Payer: Medicaid Other | Attending: Student in an Organized Health Care Education/Training Program | Admitting: Student in an Organized Health Care Education/Training Program

## 2017-04-25 ENCOUNTER — Encounter: Payer: Self-pay | Admitting: Emergency Medicine

## 2017-04-25 ENCOUNTER — Other Ambulatory Visit: Payer: Self-pay

## 2017-04-25 ENCOUNTER — Emergency Department: Payer: Medicaid Other

## 2017-04-25 DIAGNOSIS — O210 Mild hyperemesis gravidarum: Secondary | ICD-10-CM | POA: Insufficient documentation

## 2017-04-25 DIAGNOSIS — O9989 Other specified diseases and conditions complicating pregnancy, childbirth and the puerperium: Secondary | ICD-10-CM | POA: Diagnosis not present

## 2017-04-25 DIAGNOSIS — Z3A00 Weeks of gestation of pregnancy not specified: Secondary | ICD-10-CM | POA: Diagnosis not present

## 2017-04-25 DIAGNOSIS — R102 Pelvic and perineal pain: Secondary | ICD-10-CM | POA: Insufficient documentation

## 2017-04-25 DIAGNOSIS — O219 Vomiting of pregnancy, unspecified: Secondary | ICD-10-CM | POA: Diagnosis present

## 2017-04-25 DIAGNOSIS — O26899 Other specified pregnancy related conditions, unspecified trimester: Secondary | ICD-10-CM

## 2017-04-25 LAB — CBC
HCT: 46.5 % (ref 35.0–47.0)
Hemoglobin: 15.5 g/dL (ref 12.0–16.0)
MCH: 29.2 pg (ref 26.0–34.0)
MCHC: 33.4 g/dL (ref 32.0–36.0)
MCV: 87.4 fL (ref 80.0–100.0)
PLATELETS: 215 10*3/uL (ref 150–440)
RBC: 5.32 MIL/uL — AB (ref 3.80–5.20)
RDW: 13.2 % (ref 11.5–14.5)
WBC: 10.9 10*3/uL (ref 3.6–11.0)

## 2017-04-25 LAB — URINALYSIS, COMPLETE (UACMP) WITH MICROSCOPIC
Bacteria, UA: NONE SEEN
Bilirubin Urine: NEGATIVE
Glucose, UA: NEGATIVE mg/dL
Hgb urine dipstick: NEGATIVE
KETONES UR: 80 mg/dL — AB
Leukocytes, UA: NEGATIVE
Nitrite: NEGATIVE
PH: 5 (ref 5.0–8.0)
PROTEIN: 30 mg/dL — AB
Specific Gravity, Urine: 1.03 (ref 1.005–1.030)

## 2017-04-25 LAB — COMPREHENSIVE METABOLIC PANEL
ALBUMIN: 4.7 g/dL (ref 3.5–5.0)
ALT: 18 U/L (ref 14–54)
AST: 23 U/L (ref 15–41)
Alkaline Phosphatase: 55 U/L (ref 38–126)
Anion gap: 13 (ref 5–15)
BUN: 18 mg/dL (ref 6–20)
CHLORIDE: 105 mmol/L (ref 101–111)
CO2: 20 mmol/L — AB (ref 22–32)
CREATININE: 0.7 mg/dL (ref 0.44–1.00)
Calcium: 9.1 mg/dL (ref 8.9–10.3)
GFR calc Af Amer: >60 mL/min (ref >60)
GFR calc non Af Amer: >60 mL/min (ref >60)
GLUCOSE: 118 mg/dL — AB (ref 65–99)
Potassium: 3.8 mmol/L (ref 3.5–5.1)
SODIUM: 138 mmol/L (ref 135–145)
Total Bilirubin: 1.3 mg/dL — ABNORMAL HIGH (ref 0.3–1.2)
Total Protein: 8 g/dL (ref 6.5–8.1)

## 2017-04-25 LAB — LIPASE, BLOOD: LIPASE: 34 U/L (ref 11–51)

## 2017-04-25 LAB — HCG, QUANTITATIVE, PREGNANCY: HCG, BETA CHAIN, QUANT, S: 264 m[IU]/mL — AB (ref <5)

## 2017-04-25 LAB — POCT PREGNANCY, URINE: Preg Test, Ur: POSITIVE — AB

## 2017-04-25 MED ORDER — METOCLOPRAMIDE HCL 10 MG PO TABS: 10.0000 mg | ORAL_TABLET | Freq: Four times a day (QID) | ORAL | 0 refills | Status: DC | PRN

## 2017-04-25 MED ORDER — SODIUM CHLORIDE 0.9 % IV BOLUS (SEPSIS)
1000.0000 mL | Freq: Once | INTRAVENOUS | Status: AC
  Administered 2017-04-25: 1000 mL via INTRAVENOUS

## 2017-04-25 MED ORDER — METOCLOPRAMIDE HCL 5 MG/ML IJ SOLN
10.0000 mg | Freq: Once | INTRAMUSCULAR | Status: AC
  Administered 2017-04-25: 10 mg via INTRAVENOUS
  Filled 2017-04-25: qty 2

## 2017-04-25 MED ORDER — ACETAMINOPHEN 500 MG PO TABS
1000.0000 mg | ORAL_TABLET | Freq: Once | ORAL | Status: AC
  Administered 2017-04-25: 1000 mg via ORAL
  Filled 2017-04-25: qty 2

## 2017-04-25 MED ORDER — DEXTROSE 5 % AND 0.45 % NACL IV BOLUS
1000.0000 mL | Freq: Once | INTRAVENOUS | Status: AC
  Administered 2017-04-25: 1000 mL via INTRAVENOUS

## 2017-04-25 NOTE — ED Triage Notes (Addendum)
Here for central lower abdominal cramping. Pt found out via home test was pregnant this weekend, went to health dept today to get "real test" and confirm and now here to be evaluated for cramping.  Pain is not to right or left remains central. Vomit X 4 today per pt. Drinking fruit punch in triage.

## 2017-04-25 NOTE — ED Provider Notes (Signed)
[facility] Emergency Department Provider Note    None    (approximate)  I have reviewed the triage vital signs and the nursing notes.   HISTORY  Chief Complaint Emesis During Pregnancy    HPI Kristen Little is a 22 y.o. female G3P1001 presents in early pregnancy with chief complaint of nausea vomiting headache and low back pain with development of left-sided pelvic pain.  Patient is found that she is pregnant this past weekend.  Went to the health department due to her left-sided crampy abdominal pain she was sent to the ER to rule out ectopic.Marland Kitchen.  She has not been able to keep any food down for the last several days.  Did not have issues with morning sickness and previous pregnancies.  Denies any vaginal bleeding or discharge.  No diarrhea.  No epigastric pain.  History reviewed. No pertinent past medical history. Family History  Problem Relation Age of Onset  . Heart disease Maternal Grandmother   . Cancer Maternal Grandmother   . Diabetes Maternal Grandmother   . Cancer Maternal Grandfather   . Hepatitis C Paternal Grandmother    Past Surgical History:  Procedure Laterality Date  . ADENOIDECTOMY W/ MYRINGOTOMY Bilateral    done as a child  . TONSILLECTOMY     Patient Active Problem List   Diagnosis Date Noted  . Labor and delivery, indication for care 05/09/2015  . Influenza A 05/09/2015      Prior to Admission medications   Medication Sig Start Date End Date Taking? Authorizing Provider  ibuprofen (ADVIL,MOTRIN) 600 MG tablet Take 1 tablet (600 mg total) by mouth every 6 (six) hours. 06/12/15   Brothers, Delaney Meigsamara, CNM  norethindrone (ORTHO MICRONOR) 0.35 MG tablet Take 1 tablet (0.35 mg total) by mouth daily. 06/12/15   Marta AntuBrothers, Tamara, CNM  oxyCODONE (OXY IR/ROXICODONE) 5 MG immediate release tablet Take 1 tablet (5 mg total) by mouth every 4 (four) hours as needed (pain scale 4-7). 06/12/15   Marta AntuBrothers, Tamara, CNM  Prenatal Vit-Fe Fumarate-FA  (PRENATAL MULTIVITAMIN) TABS tablet Take 1 tablet by mouth daily at 12 noon.    [provider]    Allergies Azithromycin and Penicillins    Social History Social History   Tobacco Use  . Smoking status: Never Smoker  . Smokeless tobacco: Never Used  Substance Use Topics  . Alcohol use: No  . Drug use: No    Review of Systems Patient denies headaches, rhinorrhea, blurry vision, numbness, shortness of breath, chest pain, edema, cough, abdominal pain, nausea, vomiting, diarrhea, dysuria, fevers, rashes or hallucinations unless otherwise stated above in HPI. ____________________________________________   PHYSICAL EXAM:  VITAL SIGNS: Vitals:   04/25/17 1627  BP: 132/75  Pulse: (!) 107  Resp: 18  Temp: 97.8 F (36.6 C)  SpO2: 98%    Constitutional: Alert and oriented. Well appearing and in no acute distress. Eyes: Conjunctivae are normal.  Head: Atraumatic. Nose: No congestion/rhinnorhea. Mouth/Throat: Mucous membranes are moist.   Neck: No stridor. Painless ROM.  Cardiovascular: Normal rate, regular rhythm. Grossly normal heart sounds.  Good peripheral circulation. Respiratory: Normal respiratory effort.  No retractions. Lungs CTAB. Gastrointestinal: Soft and nontender. No distention. No abdominal bruits. No CVA tenderness. Genitourinary:  Musculoskeletal: No lower extremity tenderness nor edema.  No joint effusions. Neurologic:  Normal speech and language. No gross focal neurologic deficits are appreciated. No facial droop Skin:  Skin is warm, dry and intact. No rash noted. Psychiatric: Mood and affect are normal. Speech and  behavior are normal.  ____________________________________________   LABS (all labs ordered are listed, but only abnormal results are displayed)  Results for orders placed or performed during the hospital encounter of 04/25/17 (from the past 24 hour(s))  Lipase, blood     Status: None   Collection Time: 04/25/17  4:42 PM  Result  Value Ref Range   Lipase 34 11 - 51 U/L  Comprehensive metabolic panel     Status: Abnormal   Collection Time: 04/25/17  4:42 PM  Result Value Ref Range   Sodium 138 135 - 145 mmol/L   Potassium 3.8 3.5 - 5.1 mmol/L   Chloride 105 101 - 111 mmol/L   CO2 20 (L) 22 - 32 mmol/L   Glucose, Bld 118 (H) 65 - 99 mg/dL   BUN 18 6 - 20 mg/dL   Creatinine, Ser 0.86 0.44 - 1.00 mg/dL   Calcium 9.1 8.9 - 57.8 mg/dL   Total Protein 8.0 6.5 - 8.1 g/dL   Albumin 4.7 3.5 - 5.0 g/dL   AST 23 15 - 41 U/L   ALT 18 14 - 54 U/L   Alkaline Phosphatase 55 38 - 126 U/L   Total Bilirubin 1.3 (H) 0.3 - 1.2 mg/dL   GFR calc non Af Amer >60 >60 mL/min   GFR calc Af Amer >60 >60 mL/min   Anion gap 13 5 - 15  CBC     Status: Abnormal   Collection Time: 04/25/17  4:42 PM  Result Value Ref Range   WBC 10.9 3.6 - 11.0 K/uL   RBC 5.32 (H) 3.80 - 5.20 MIL/uL   Hemoglobin 15.5 12.0 - 16.0 g/dL   HCT 46.9 62.9 - 52.8 %   MCV 87.4 80.0 - 100.0 fL   MCH 29.2 26.0 - 34.0 pg   MCHC 33.4 32.0 - 36.0 g/dL   RDW 41.3 24.4 - 01.0 %   Platelets 215 150 - 440 K/uL  Urinalysis, Complete w Microscopic     Status: Abnormal   Collection Time: 04/25/17  4:42 PM  Result Value Ref Range   Color, Urine YELLOW (A) YELLOW   APPearance HAZY (A) CLEAR   Specific Gravity, Urine 1.030 1.005 - 1.030   pH 5.0 5.0 - 8.0   Glucose, UA NEGATIVE NEGATIVE mg/dL   Hgb urine dipstick NEGATIVE NEGATIVE   Bilirubin Urine NEGATIVE NEGATIVE   Ketones, ur 80 (A) NEGATIVE mg/dL   Protein, ur 30 (A) NEGATIVE mg/dL   Nitrite NEGATIVE NEGATIVE   Leukocytes, UA NEGATIVE NEGATIVE   RBC / HPF 0-5 0 - 5 RBC/hpf   WBC, UA 0-5 0 - 5 WBC/hpf   Bacteria, UA NONE SEEN NONE SEEN   Squamous Epithelial / LPF 0-5 (A) NONE SEEN   Mucus PRESENT   Pregnancy, urine POC     Status: Abnormal   Collection Time: 04/25/17  4:51 PM  Result Value Ref Range   Preg Test, Ur POSITIVE (A) NEGATIVE    ____________________________________________ ____________________________________________  RADIOLOGY  I personally reviewed all radiographic images ordered to evaluate for the above acute complaints and reviewed radiology reports and findings.  These findings were personally discussed with the patient.  Please see medical record for radiology report.  ____________________________________________   PROCEDURES  Procedure(s) performed:  Procedures    Critical Care performed: no ____________________________________________   INITIAL IMPRESSION / ASSESSMENT AND PLAN / ED COURSE  Pertinent labs & imaging results that were available during my care of the patient were reviewed by me and considered  in my medical decision making (see chart for details).  DDX: ectopic, molar, hyperemesis, uti  HOLLAND KOTTER is a 22 y.o. who presents to the ED with nausea vomiting in early pregnancy with left pelvic cramping and low back pain.  Denies any dysuria.  No fevers.  Ultrasound ordered to exclude ectopic shows evidence of IUP but too early in pregnancy to confirm viability.  Patient given IV fluids for her dehydration and ketonuria.  I do suspect her ketosis is worsening her hyperemesis.  Patient was given a dose of IV Reglan.  Symptoms improved.  Discussed risk benefits of outpatient antiemetic medication and patient agrees to a trial of short course of Reglan.  Have discussed with the patient and available family all diagnostics and treatments performed thus far and all questions were answered to the best of my ability. The patient demonstrates understanding and agreement with plan.       ____________________________________________   FINAL CLINICAL IMPRESSION(S) / ED DIAGNOSES  Final diagnoses:  Hyperemesis affecting pregnancy, antepartum  Pelvic pain during pregnancy      NEW MEDICATIONS STARTED DURING THIS VISIT:  New Prescriptions   No medications on file     Note:  This  document was prepared using Dragon voice recognition software and may include unintentional dictation errors.    Willy Eddy, MD 04/25/17 [phone]

## 2017-04-25 NOTE — Discharge Instructions (Signed)

## 2017-04-25 NOTE — ED Notes (Signed)
Pt signed esignatue.  D/c inst to pt

## 2017-04-25 NOTE — ED Notes (Signed)
Pt reports abd pain with n/v for past 2 days.  Pt is pregnant.  G3p2a0.  No vag bleeding.  No dysuria.  No back pain.

## 2017-05-21 ENCOUNTER — Other Ambulatory Visit: Payer: Self-pay | Admitting: Advanced Practice Midwife

## 2017-05-21 DIAGNOSIS — Z3482 Encounter for supervision of other normal pregnancy, second trimester: Secondary | ICD-10-CM

## 2017-05-28 ENCOUNTER — Other Ambulatory Visit: Payer: Self-pay | Admitting: Advanced Practice Midwife

## 2017-05-28 ENCOUNTER — Ambulatory Visit
Admission: RE | Admit: 2017-05-28 | Discharge: 2017-05-28 | Disposition: A | Discharge status: Home / Self Care | Payer: Medicaid Other | Source: Ambulatory Visit | Attending: Advanced Practice Midwife | Admitting: Advanced Practice Midwife

## 2017-05-28 DIAGNOSIS — O0991 Supervision of high risk pregnancy, unspecified, first trimester: Secondary | ICD-10-CM | POA: Insufficient documentation

## 2017-05-28 DIAGNOSIS — Z3A09 9 weeks gestation of pregnancy: Secondary | ICD-10-CM | POA: Diagnosis not present

## 2017-05-28 DIAGNOSIS — Z3491 Encounter for supervision of normal pregnancy, unspecified, first trimester: Secondary | ICD-10-CM

## 2017-05-28 DIAGNOSIS — Z3482 Encounter for supervision of other normal pregnancy, second trimester: Secondary | ICD-10-CM

## 2017-08-03 ENCOUNTER — Other Ambulatory Visit: Payer: Self-pay | Admitting: Cardiovascular Disease

## 2017-08-03 DIAGNOSIS — Z3689 Encounter for other specified antenatal screening: Secondary | ICD-10-CM

## 2017-08-07 ENCOUNTER — Ambulatory Visit (INDEPENDENT_AMBULATORY_CARE_PROVIDER_SITE_OTHER): Payer: Medicaid Other

## 2017-08-07 DIAGNOSIS — Z3689 Encounter for other specified antenatal screening: Secondary | ICD-10-CM

## 2017-08-07 DIAGNOSIS — Z363 Encounter for antenatal screening for malformations: Secondary | ICD-10-CM

## 2017-09-25 ENCOUNTER — Other Ambulatory Visit: Payer: Self-pay | Admitting: Cardiovascular Disease

## 2017-09-25 DIAGNOSIS — Z3483 Encounter for supervision of other normal pregnancy, third trimester: Secondary | ICD-10-CM

## 2017-10-08 ENCOUNTER — Other Ambulatory Visit: Payer: Medicaid Other

## 2017-10-09 ENCOUNTER — Ambulatory Visit (INDEPENDENT_AMBULATORY_CARE_PROVIDER_SITE_OTHER): Payer: Medicaid Other

## 2017-10-09 DIAGNOSIS — Z3482 Encounter for supervision of other normal pregnancy, second trimester: Secondary | ICD-10-CM | POA: Diagnosis not present

## 2017-10-09 DIAGNOSIS — Z3A27 27 weeks gestation of pregnancy: Secondary | ICD-10-CM | POA: Diagnosis not present

## 2017-10-09 DIAGNOSIS — Z3483 Encounter for supervision of other normal pregnancy, third trimester: Secondary | ICD-10-CM

## 2018-06-01 ENCOUNTER — Encounter: Payer: Self-pay | Admitting: Emergency Medicine

## 2018-06-01 ENCOUNTER — Other Ambulatory Visit: Payer: Self-pay

## 2018-06-01 ENCOUNTER — Emergency Department: Payer: Medicaid Other

## 2018-06-01 ENCOUNTER — Emergency Department
Admission: EM | Admit: 2018-06-01 | Discharge: 2018-06-01 | Disposition: A | Discharge status: Home / Self Care | Payer: Medicaid Other | Attending: Emergency Medicine | Admitting: Emergency Medicine

## 2018-06-01 DIAGNOSIS — Z79899 Other long term (current) drug therapy: Secondary | ICD-10-CM | POA: Diagnosis not present

## 2018-06-01 DIAGNOSIS — R52 Pain, unspecified: Secondary | ICD-10-CM

## 2018-06-01 DIAGNOSIS — R102 Pelvic and perineal pain: Secondary | ICD-10-CM | POA: Insufficient documentation

## 2018-06-01 LAB — URINALYSIS, COMPLETE (UACMP) WITH MICROSCOPIC
Bilirubin Urine: NEGATIVE
Glucose, UA: NEGATIVE mg/dL
Ketones, ur: NEGATIVE mg/dL
Leukocytes,Ua: NEGATIVE
Nitrite: NEGATIVE
Protein, ur: NEGATIVE mg/dL
Specific Gravity, Urine: 1.021 (ref 1.005–1.030)
pH: 5 (ref 5.0–8.0)

## 2018-06-01 LAB — COMPREHENSIVE METABOLIC PANEL
ALT: 16 U/L (ref 0–44)
AST: 21 U/L (ref 15–41)
Albumin: 4.5 g/dL (ref 3.5–5.0)
Alkaline Phosphatase: 52 U/L (ref 38–126)
Anion gap: 9 (ref 5–15)
BUN: 13 mg/dL (ref 6–20)
CO2: 22 mmol/L (ref 22–32)
Calcium: 9.3 mg/dL (ref 8.9–10.3)
Chloride: 108 mmol/L (ref 98–111)
Creatinine, Ser: 0.77 mg/dL (ref 0.44–1.00)
GFR calc Af Amer: >60 mL/min (ref >60)
GFR calc non Af Amer: >60 mL/min (ref >60)
Glucose, Bld: 93 mg/dL (ref 70–99)
Potassium: 3.9 mmol/L (ref 3.5–5.1)
Sodium: 139 mmol/L (ref 135–145)
Total Bilirubin: 0.6 mg/dL (ref 0.3–1.2)
Total Protein: 8.2 g/dL — ABNORMAL HIGH (ref 6.5–8.1)

## 2018-06-01 LAB — CBC WITH DIFFERENTIAL/PLATELET
Abs Immature Granulocytes: 0.02 10*3/uL (ref 0.00–0.07)
Basophils Absolute: 0 10*3/uL (ref 0.0–0.1)
Basophils Relative: 0 %
Eosinophils Absolute: 0.1 10*3/uL (ref 0.0–0.5)
Eosinophils Relative: 1 %
HCT: 48.5 % — ABNORMAL HIGH (ref 36.0–46.0)
Hemoglobin: 15.9 g/dL — ABNORMAL HIGH (ref 12.0–15.0)
Immature Granulocytes: 0 %
Lymphocytes Relative: 22 %
Lymphs Abs: 1.8 10*3/uL (ref 0.7–4.0)
MCH: 29.9 pg (ref 26.0–34.0)
MCHC: 32.8 g/dL (ref 30.0–36.0)
MCV: 91.3 fL (ref 80.0–100.0)
Monocytes Absolute: 0.4 10*3/uL (ref 0.1–1.0)
Monocytes Relative: 4 %
Neutro Abs: 6 10*3/uL (ref 1.7–7.7)
Neutrophils Relative %: 73 %
Platelets: 232 10*3/uL (ref 150–400)
RBC: 5.31 MIL/uL — ABNORMAL HIGH (ref 3.87–5.11)
RDW: 11.7 % (ref 11.5–15.5)
WBC: 8.2 10*3/uL (ref 4.0–10.5)
nRBC: 0 % (ref 0.0–0.2)

## 2018-06-01 LAB — WET PREP, GENITAL
Clue Cells Wet Prep HPF POC: NONE SEEN
Sperm: NONE SEEN
Trich, Wet Prep: NONE SEEN
Yeast Wet Prep HPF POC: NONE SEEN

## 2018-06-01 LAB — POCT PREGNANCY, URINE: Preg Test, Ur: NEGATIVE

## 2018-06-01 LAB — CHLAMYDIA/NGC RT PCR (ARMC ONLY)??????????: Chlamydia Tr: NOT DETECTED

## 2018-06-01 LAB — CHLAMYDIA/NGC RT PCR (ARMC ONLY): N gonorrhoeae: NOT DETECTED

## 2018-06-01 MED ORDER — CEFTRIAXONE SODIUM 250 MG IJ SOLR
250.0000 mg | Freq: Once | INTRAMUSCULAR | Status: AC
  Administered 2018-06-01: 250 mg via INTRAMUSCULAR
  Filled 2018-06-01: qty 250

## 2018-06-01 MED ORDER — AZITHROMYCIN 1 G PO PACK
1.0000 g | PACK | Freq: Once | ORAL | Status: AC
  Administered 2018-06-01: 1 g via ORAL
  Filled 2018-06-01: qty 1

## 2018-06-01 MED ORDER — PROMETHAZINE HCL 25 MG PO TABS
25.0000 mg | ORAL_TABLET | Freq: Once | ORAL | Status: AC
  Administered 2018-06-01: 25 mg via ORAL
  Filled 2018-06-01: qty 1

## 2018-06-01 NOTE — ED Notes (Signed)
[facility] on [facility] and tested for std - negative and was told either pid or endometriosis. Was placed on levaquin and flagyl. On her recheck she was complaining of vivid dreams with the medication so was switched to doxycycline. States came in today b/c she had a headache all day. States the pain is increased as well and is suppose to be on her period but since she has been having light bleeding throughout the symptoms is still only having a light period. Pt is in nad but does seem nervous about her symptoms.

## 2018-06-01 NOTE — ED Triage Notes (Signed)
Pt to ED via POV c/o pelvic pain. Pt states that she was diagnosed with PID about 1 week ago. Pt states that she has been taking medication but is not better. Pt is in NAD.

## 2018-06-01 NOTE — Discharge Instructions (Signed)
Think that between the antibiotics the health department gave you and the antibiotics we gave you here today you should be good for any kind of pelvic infection.  I am not sure that you are having PID anyway but the safest thing to have done would have been to treat you.  Please call [facility] to follow-up with Dr. Jean Rosenthal.  Call them on Monday set up an appointment this coming week.  If you have any increasing pain fever nausea or vomiting or any other complaints please come back.

## 2018-06-01 NOTE — ED Notes (Signed)
Pt back from ultrasound.

## 2018-06-01 NOTE — ED Provider Notes (Signed)
[facility] Emergency Department Provider Note   ____________________________________________   First MD Initiated Contact with Patient 06/01/18 1115     (approximate)  I have reviewed the triage vital signs and the nursing notes.   HISTORY  Chief Complaint Pelvic Pain    HPI Kristen Little is a 23 y.o. female who reports about 1 month of pelvic pain.  It started as pain and bleeding with intercourse.  She went to see the health department was put on Flagyl and Levaquin for possible PID.  Her specimens were negative though..  She had to stop this because of vivid dreams.  She was stopped on Wednesday Thursday she took nothing and Friday she was started on doxycycline.  Soon after starting the doxycycline she got a headache.  The headache is continued.  She did not take the doxycycline today.  She still has a headache.  She has had lower abdominal pain with some spotting since the beginning of this problem.  No fever vomiting or other complaints.  She has not had sex and she was told not to.         History reviewed. No pertinent past medical history.  Patient Active Problem List   Diagnosis Date Noted  . Labor and delivery, indication for care 05/09/2015  . Influenza A 05/09/2015    Past Surgical History:  Procedure Laterality Date  . ADENOIDECTOMY W/ MYRINGOTOMY Bilateral    done as a child  . TONSILLECTOMY      Prior to Admission medications   Medication Sig Start Date End Date Taking? Authorizing Provider  ibuprofen (ADVIL,MOTRIN) 600 MG tablet Take 1 tablet (600 mg total) by mouth every 6 (six) hours. 06/12/15   Brothers, Delaney Meigs, CNM  metoCLOPramide (REGLAN) 10 MG tablet Take 1 tablet (10 mg total) by mouth every 6 (six) hours as needed for nausea. 04/25/17 04/25/18  Willy Eddy, MD  norethindrone (ORTHO MICRONOR) 0.35 MG tablet Take 1 tablet (0.35 mg total) by mouth daily. 06/12/15   Marta Antu, CNM  oxyCODONE (OXY IR/ROXICODONE) 5  MG immediate release tablet Take 1 tablet (5 mg total) by mouth every 4 (four) hours as needed (pain scale 4-7). 06/12/15   Marta Antu, CNM  Prenatal Vit-Fe Fumarate-FA (PRENATAL MULTIVITAMIN) TABS tablet Take 1 tablet by mouth daily at 12 noon.    [provider]    Allergies Azithromycin and Penicillins  Family History  Problem Relation Age of Onset  . Heart disease Maternal Grandmother   . Cancer Maternal Grandmother   . Diabetes Maternal Grandmother   . Cancer Maternal Grandfather   . Hepatitis C Paternal Grandmother     Social History Social History   Tobacco Use  . Smoking status: Never Smoker  . Smokeless tobacco: Never Used  Substance Use Topics  . Alcohol use: No  . Drug use: No    Review of Systems  Constitutional: No fever/chills Eyes: No visual changes. ENT: No sore throat. Cardiovascular: Denies chest pain. Respiratory: Denies shortness of breath. Gastrointestinal:  abdominal pain.  No nausea, no vomiting.  No diarrhea.  No constipation. Genitourinary: Negative for dysuria. Musculoskeletal: Negative for back pain. Skin: Negative for rash. Neurological: Negative for headaches, focal weakness  ____________________________________________   PHYSICAL EXAM:  VITAL SIGNS: ED Triage Vitals  Enc Vitals Group     BP 06/01/18 1101 137/85     Pulse Rate 06/01/18 1101 (!) 101     Resp 06/01/18 1101 16     Temp 06/01/18 1101  98.3 F (36.8 C)     Temp Source 06/01/18 1101 Oral     SpO2 06/01/18 1101 100 %     Weight --      Height --      Head Circumference --      Peak Flow --      Pain Score 06/01/18 1102 7     Pain Loc --      Pain Edu? --      Excl. in GC? --     Constitutional: Alert and oriented. Well appearing and in no acute distress. Eyes: Conjunctivae are normal. PERRL. EOMI. fundi are normal no sign of papilledema Head: Atraumatic. Nose: No congestion/rhinnorhea. Mouth/Throat: Mucous membranes are moist.  Oropharynx  non-erythematous. Neck: No stridor.   Cardiovascular: Normal rate, regular rhythm. Grossly normal heart sounds.  Good peripheral circulation. Respiratory: Normal respiratory effort.  No retractions. Lungs CTAB. Gastrointestinal: Soft and nontender except for to palpation in the lower abdomen.  There is a slight feeling that percussion is different in the lower abdomen and upper but not really tender.. No distention. No abdominal bruits. No CVA tenderness. Genitourinary: Normal perineum.  Small amount of blood in the vagina.  No active bleeding.  There is minimal adnexal and cervical motion tenderness. Musculoskeletal: No lower extremity tenderness nor edema.   Neurologic:  Normal speech and language. No gross focal neurologic deficits are appreciated. No gait instability. Skin:  Skin is warm, dry and intact. No rash noted.  ____________________________________________   LABS (all labs ordered are listed, but only abnormal results are displayed)  Labs Reviewed  WET PREP, GENITAL - Abnormal; Notable for the following components:      Result Value   WBC, Wet Prep HPF POC FEW (*)    All other components within normal limits  COMPREHENSIVE METABOLIC PANEL - Abnormal; Notable for the following components:   Total Protein 8.2 (*)    All other components within normal limits  CBC WITH DIFFERENTIAL/PLATELET - Abnormal; Notable for the following components:   RBC 5.31 (*)    Hemoglobin 15.9 (*)    HCT 48.5 (*)    All other components within normal limits  URINALYSIS, COMPLETE (UACMP) WITH MICROSCOPIC - Abnormal; Notable for the following components:   Color, Urine YELLOW (*)    APPearance CLEAR (*)    Hgb urine dipstick MODERATE (*)    Bacteria, UA RARE (*)    All other components within normal limits  CHLAMYDIA/NGC RT PCR ([facility] ONLY)  POC URINE PREG, ED  POCT PREGNANCY, URINE   ____________________________________________  EKG   ____________________________________________   RADIOLOGY  ED MD interpretation personally read by radiology is no acute disease.  Official radiology report(s): [place] Pelvic Complete With Transvaginal  Result Date: 06/01/2018 CLINICAL DATA:  Bleeding and pain for a month. EXAM: TRANSABDOMINAL AND TRANSVAGINAL ULTRASOUND OF PELVIS TECHNIQUE: Both transabdominal and transvaginal ultrasound examinations of the pelvis were performed. Transabdominal technique was performed for global imaging of the pelvis including uterus, ovaries, adnexal regions, and pelvic cul-de-sac. It was necessary to proceed with endovaginal exam following the transabdominal exam to visualize the endometrium and ovaries. COMPARISON:  None FINDINGS: Uterus Measurements: 7.8 x 4.5 x 5.5 cm = volume: 101 mL. No fibroids or other mass visualized. Endometrium Thickness: 8.5 mm.  No focal abnormality visualized. Right ovary Measurements: 3.1 x 1.9 x 1.6 cm = volume: 4.7 mL. Probable corpus luteum cyst on the right. Left ovary Measurements: 2.0 x 1.3 x 2.2 cm = volume: 3.0 mL.  Normal appearance/no adnexal mass. Other findings No abnormal free fluid. IMPRESSION: 1. No abnormalities to explain the patient's symptoms. Probable small corpus luteum cyst in the right ovary. Electronically Signed   By:Gerome Samiams III M.D   On: 06/01/2018 12:59    ____________________________________________   PROCEDURES  Procedure(s) performed (including Critical Care):  Procedures   ____________________________________________   INITIAL IMPRESSION / ASSESSMENT AND PLAN / ED COURSE  Patient has had about 8 or 9 days of antibiotics.  She had the Flagyl and Levaquin for 7 days and then at least 1 day of doxycycline.  1 day break in between.  She reported that her GC and Chlamydia were negative.  Her wet prep here is negative GC and Chlamydia are still pending.  I am not sure that she actually did have PID although of course multiple different bacteria can cause it.  I will give her a gram of  Zithromax and 250 Rocephin now in the hopes that that can complete her treatment.  I will have her follow-up with [facility] using her pregnancy Medicaid.  That will give her an expert opinion on what is going on with her.             ____________________________________________   FINAL CLINICAL IMPRESSION(S) / ED DIAGNOSES  Final diagnoses:  Pain  Pelvic pain in female     ED Discharge Orders    None       Note:  This document was prepared using Dragon voice recognition software and may include unintentional dictation errors.    Arnaldo Natal, MD 06/01/18 1328

## 2018-09-06 ENCOUNTER — Encounter: Payer: Self-pay | Admitting: Obstetrics and Gynecology

## 2018-12-30 DIAGNOSIS — N939 Abnormal uterine and vaginal bleeding, unspecified: Secondary | ICD-10-CM | POA: Insufficient documentation

## 2019-02-14 HISTORY — PX: CHOLECYSTECTOMY: SHX55

## 2019-05-30 ENCOUNTER — Telehealth: Payer: Self-pay | Admitting: Family Medicine

## 2019-05-30 NOTE — Telephone Encounter (Signed)
RX REFILL EXPIRED FOR B/C CALL [facility] [place].

## 2019-06-02 NOTE — Telephone Encounter (Signed)
Refill request received by fax for patient for Ortho Cyclen.  Per chart review last RP 01/2018, last visit 02/2018 when changed to continuous dosing for fewer periods.  CBE and pap due this year.  Will OK one refill for patient and then will need in-person visit for pap and further refills.  Fax form completed and faxed with confirmation received.

## 2019-06-05 ENCOUNTER — Other Ambulatory Visit: Payer: Self-pay | Admitting: Family Medicine

## 2019-06-05 NOTE — Telephone Encounter (Signed)
Patient phone call returned. Patient states she needs refills on her birth control pills and has schedule an appt for an OV for tomorrow 06/06/19 but has since had complications from surgery and now cannot keep scheduled appt. Informed patient that per 06/02/19 refill request faxed from preferred pharmacy that 1 refill for birth control pills has been approved. Patient requested to cancel tomorrows appt and will call to reschedule. Appt cancelled per patient request. RN counseled to call pharmacy to ensure that Rx refill is ready to be picked up. Patient verbalized understanding. Tawny Hopping, RN

## 2019-06-05 NOTE — Telephone Encounter (Signed)
PATIENT NEEDS MORE BIRTH CONTROL PILLS, SHE IS RECOVERING FROM A SURGERY SHE HAD, SO SHE IS UNSURE OF BN ABLE TO COME IN FOR A VISIT RIGHT NOW. PLEASE CALL BACK.

## 2019-06-06 ENCOUNTER — Other Ambulatory Visit: Payer: Self-pay | Admitting: Family Medicine

## 2019-06-06 ENCOUNTER — Ambulatory Visit: Payer: Self-pay

## 2019-06-06 NOTE — Telephone Encounter (Signed)
rx refill  Sprintec call pharmacy ask for [facility].

## 2019-06-06 NOTE — Telephone Encounter (Signed)
TC to patient to let her know refill prescribed and to scheduled patient for Pap and new OC orders. Patient states she is recovering from surgery and her husband is working and in school and she is currently not sure when she can come in. Patient counseled to call as soon as she can for appointment, and to make sure to call before she finishes her OC. Patient declines to schedule today, but says she will call as soon as she can figure out when she can come in. Burt Knack, RN

## 2019-06-06 NOTE — Telephone Encounter (Signed)
Received message from pharmacy and call to same at 2:30pm.  Put on hold and then was cut off.  Call to pharmacy again and put on hold at 2:33pm, at 2:38pm [place] on phone and states that they do not have the fax that was sent earlier this week per my note.  Verbally gave ok for patient to have one cycle of Sprintec/Ortho Cyclen 28d 1 po daily at the same time daily.  Patient talked with RN on 06/05/2019, and is to schedule an appointment with provider for pap and OCs.

## 2019-07-04 ENCOUNTER — Ambulatory Visit: Payer: Self-pay

## 2019-07-04 ENCOUNTER — Other Ambulatory Visit: Payer: Self-pay | Admitting: Family Medicine

## 2019-07-04 ENCOUNTER — Other Ambulatory Visit: Payer: Self-pay

## 2019-07-04 ENCOUNTER — Encounter: Payer: Self-pay | Admitting: Family Medicine

## 2019-07-04 ENCOUNTER — Ambulatory Visit (LOCAL_COMMUNITY_HEALTH_CENTER): Payer: Medicaid Other | Admitting: Family Medicine

## 2019-07-04 VITALS — BP 130/93 | Ht 67.0 in | Wt 224.8 lb

## 2019-07-04 DIAGNOSIS — Z30011 Encounter for initial prescription of contraceptive pills: Secondary | ICD-10-CM

## 2019-07-04 DIAGNOSIS — Z01419 Encounter for gynecological examination (general) (routine) without abnormal findings: Secondary | ICD-10-CM | POA: Diagnosis not present

## 2019-07-04 DIAGNOSIS — G43909 Migraine, unspecified, not intractable, without status migrainosus: Secondary | ICD-10-CM | POA: Insufficient documentation

## 2019-07-04 MED ORDER — THERA VITAL M PO TABS: 1.0000 | ORAL_TABLET | Freq: Every day | ORAL | 0 refills | Status: DC

## 2019-07-04 MED ORDER — NORETHINDRONE 0.35 MG PO TABS: 1.0000 | ORAL_TABLET | Freq: Every day | ORAL | 13 refills | Status: DC

## 2019-07-04 NOTE — Telephone Encounter (Signed)
Phone call to pt. Counseled pt that Dr has reviewed chart from the morning and that Rx was sent but problem may have occurred due to name change from [place] to [place] during visit. Dr has resent the Rx. Pt to check with pharmacy about last name changes. Pt to call [facility] back if she is unable to get Rx filled.

## 2019-07-04 NOTE — Progress Notes (Signed)
Family Planning Visit- Initial Visit  Subjective:  Kristen Little is a 24 y.o.  G3P1001   being seen today for an initial well woman visit and to discuss family planning options.  She is currently using OCP (estrogen/progesterone) for pregnancy prevention. Patient reports she does not want a pregnancy in the next year.  Patient has the following medical conditions has Abnormal uterine bleeding and Migraines on their problem list.  Chief Complaint  Patient presents with  . Annual Exam  . Contraception    OC    Patient reports likes sprintec, does have history of migraines and has been on micronor prior. She reports recent gallbladder infection (see ROS)  Patient denies current liver disease.    Body mass index is 35.21 kg/m. - Patient is eligible for diabetes screening based on BMI and age >65?  no HA1C ordered? no  Patient reports 1 of partners in last year. Desires STI screening?  No - declines today  Has patient been screened once for HCV in the past?  No  No results found for: HCVAB  Does the patient have current of drug use, have a partner with drug use, and/or has been incarcerated since last result? No  If yes-- Screen for HCV through [facility]   Does the patient meet criteria for HBV testing? No  Criteria:  -Household, sexual or needle sharing contact with HBV -History of drug use -HIV positive -Those with known Hep C   Health Maintenance Due  Topic Date Due  . PAP SMEAR-Modifier  Never done  . COVID-19 Vaccine (1) Never done  . CHLAMYDIA SCREENING  03/16/2018    Review of Systems  Constitutional: Positive for fever (associated with gallbladder infection). Negative for chills, malaise/fatigue and weight loss.  HENT: Negative for ear pain.   Eyes:       Yellow eyes-- gallbladder infection   Respiratory: Negative for cough and shortness of breath.   Cardiovascular: Negative for chest pain and palpitations.  Gastrointestinal: Positive for nausea  (associated with gallladder infection).  Genitourinary: Negative for dysuria.  Neurological: Positive for dizziness and headaches (h/o migraines has HA every week to 2 weeks).  Endo/Heme/Allergies: Bruises/bleeds easily.   Reviewed health survey that has multiple positives-- all related to recent gallbladder infection. Only one that is not is her headaches.   The following portions of the patient's history were reviewed and updated as appropriate: allergies, current medications, past family history, past medical history, past social history, past surgical history and problem list. Problem list updated.   See flowsheet for other program required questions.  Objective:   Vitals:   07/04/19 1006  BP: (!) 130/93  Weight: 224 lb 12.8 oz (102 kg)  Height: 5\' 7"  (1.702 m)    Physical Exam Constitutional:      Appearance: Normal appearance.  HENT:     Head: Normocephalic and atraumatic.  Pulmonary:     Effort: Pulmonary effort is normal.  Abdominal:     Palpations: Abdomen is soft.  Musculoskeletal:        General: Normal range of motion.  Skin:    General: Skin is warm and dry.  Neurological:     General: No focal deficit present.     Mental Status: She is alert.  Psychiatric:        Mood and Affect: Mood normal.        Behavior: Behavior normal.       Assessment and Plan:  Kristen Little is a  24 y.o. female presenting to the [facility] Department for an initial well woman exam/family planning visit  Contraception counseling: Reviewed all forms of birth control options in the tiered based approach. available including abstinence; over the counter/barrier methods; hormonal contraceptive medication including pill, patch, ring, injection,contraceptive implant, ECP; hormonal and nonhormonal IUDs; permanent sterilization options including vasectomy and the various tubal sterilization modalities. Risks, benefits, and typical effectiveness rates were reviewed.  Questions  were answered.  Written information was also given to the patient to review.  Patient desires POP, this was prescribed for patient. She will follow up in  1 year for surveillance.  She was told to call with any further questions, or with any concerns about this method of contraception.  Emphasized use of condoms 100% of the time for STI prevention.  ECP not indicated  1. Migraine without status migrainosus, not intractable, unspecified migraine type Discussed that progesterone is safer option as it does seem patient has auras  2. Well woman exam with routine gynecological exam Pap in Nov at [facility] Discussed diet and weight loss Reviewed reproductive life planning Reviewed IUD effectiveness and safety Declined all STI screenings today- in monogamous relationship  3. Encounter for initial prescription of contraceptive pills - norethindrone (MICRONOR) 0.35 MG tablet; Take 1 tablet (0.35 mg total) by mouth daily.  Dispense: 1 Package; Refill: 13   Return in about 1 year (around 07/03/2020) for Yearly wellness exam.  No future appointments.  Federico Flake, MD

## 2019-07-04 NOTE — Telephone Encounter (Signed)
Patient needs [facility] prescription.

## 2019-07-04 NOTE — Progress Notes (Signed)
Patient signed OC consent. No labs done today. Burt Knack, RN

## 2019-07-04 NOTE — Progress Notes (Signed)
Here for PE and new OC prescription. Being followed by Dr. Daivd Council at [facility] for Pap ASCUS in 2020. Marland KitchenBurt Knack, RN

## 2019-07-04 NOTE — Addendum Note (Signed)
Addended by: Geanie Berlin on: 07/04/2019 04:32 PM   Modules accepted: Orders

## 2019-07-04 NOTE — Addendum Note (Signed)
Addended by: Burt Knack on: 07/04/2019 11:54 AM   Modules accepted: Orders

## 2020-01-01 ENCOUNTER — Emergency Department
Admission: EM | Admit: 2020-01-01 | Discharge: 2020-01-01 | Disposition: A | Discharge status: Home / Self Care | Payer: Medicaid Other | Attending: Emergency Medicine | Admitting: Emergency Medicine

## 2020-01-01 ENCOUNTER — Other Ambulatory Visit: Payer: Self-pay

## 2020-01-01 ENCOUNTER — Encounter: Payer: Self-pay | Admitting: Emergency Medicine

## 2020-01-01 DIAGNOSIS — L509 Urticaria, unspecified: Secondary | ICD-10-CM

## 2020-01-01 DIAGNOSIS — T7840XA Allergy, unspecified, initial encounter: Secondary | ICD-10-CM | POA: Insufficient documentation

## 2020-01-01 MED ORDER — DIPHENHYDRAMINE HCL 50 MG/ML IJ SOLN
50.0000 mg | Freq: Once | INTRAMUSCULAR | Status: AC
  Administered 2020-01-01: 50 mg via INTRAVENOUS
  Filled 2020-01-01: qty 1

## 2020-01-01 MED ORDER — FAMOTIDINE IN NACL 20-0.9 MG/50ML-% IV SOLN
20.0000 mg | Freq: Once | INTRAVENOUS | Status: AC
  Administered 2020-01-01: 20 mg via INTRAVENOUS
  Filled 2020-01-01: qty 50

## 2020-01-01 NOTE — ED Notes (Signed)
Pt reports she is no longer itching and burning is gone.

## 2020-01-01 NOTE — ED Notes (Signed)
Pt reports using new scented spray on bed sheets and reports red rash all over body that is burning 6/10. Pt reports it is now spreading to back of neck. Denies shob, throat closing, etc. Pt states she does feel a little nauseous "but I'm pretty anxious". Pt states taking 1 500mg  tylenol to relieve burning at home and scrubbing in shower to try and relieve itching with no success.

## 2020-01-01 NOTE — ED Provider Notes (Signed)
[facility] Emergency Department Provider Note  ____________________________________________  Time seen: Approximately 7:01 AM  I have reviewed the triage vital signs and the nursing notes.   HISTORY  Chief Complaint Allergic Reaction   HPI Kristen Little is a 24 y.o. female who presents for evaluation of hives.  Patient reports that she started using a new fabric spray on her bed sheets.  She went to sleep this evening feeling well and woke up around 1:30am with generalized hives.  No nausea, vomiting, shortness of breath, vomiting, diarrhea, angioedema, difficulty swallowing or breathing.  Patient denies any prior history of allergic reactions.  No new medications or new foods.   History reviewed. No pertinent past medical history.  Patient Active Problem List   Diagnosis Date Noted  . Migraines 07/04/2019  . Abnormal uterine bleeding 12/30/2018    Past Surgical History:  Procedure Laterality Date  . ADENOIDECTOMY W/ MYRINGOTOMY Bilateral    done as a child  . CHOLECYSTECTOMY  2021  . TONSILLECTOMY      Prior to Admission medications   Medication Sig Start Date End Date Taking? Authorizing Provider  ibuprofen (ADVIL,MOTRIN) 600 MG tablet Take 1 tablet (600 mg total) by mouth every 6 (six) hours. Patient not taking: Reported on 07/04/2019 06/12/15   Marta Antu, CNM  metoCLOPramide (REGLAN) 10 MG tablet Take 1 tablet (10 mg total) by mouth every 6 (six) hours as needed for nausea. 04/25/17 04/25/18  Willy Eddy, MD  Multiple Vitamins-Minerals (MULTIVITAMIN) tablet Take 1 tablet by mouth daily. 07/04/19   Federico Flake, MD  norethindrone (MICRONOR) 0.35 MG tablet Take 1 tablet (0.35 mg total) by mouth daily. 07/04/19   Federico Flake, MD  oxyCODONE (OXY IR/ROXICODONE) 5 MG immediate release tablet Take 1 tablet (5 mg total) by mouth every 4 (four) hours as needed (pain scale 4-7). Patient not taking: Reported on 07/04/2019 06/12/15    Marta Antu, CNM  Prenatal Vit-Fe Fumarate-FA (PRENATAL MULTIVITAMIN) TABS tablet Take 1 tablet by mouth daily at 12 noon.    [provider]    Allergies Azithromycin and Penicillins  Family History  Problem Relation Age of Onset  . Heart disease Maternal Grandmother   . Cancer Maternal Grandmother   . Diabetes Maternal Grandmother   . Cancer Maternal Grandfather   . Hepatitis C Paternal Grandmother     Social History Social History   Tobacco Use  . Smoking status: Never Smoker  . Smokeless tobacco: Never Used  Substance Use Topics  . Alcohol use: No  . Drug use: No    Review of Systems  Constitutional: Negative for fever. Eyes: Negative for visual changes. ENT: Negative for sore throat. Neck: No neck pain  Cardiovascular: Negative for chest pain. Respiratory: Negative for shortness of breath. Gastrointestinal: Negative for abdominal pain, vomiting or diarrhea. Genitourinary: Negative for dysuria. Musculoskeletal: Negative for back pain. Skin: + hives Neurological: Negative for headaches, weakness or numbness. Psych: No SI or HI  ____________________________________________   PHYSICAL EXAM:  VITAL SIGNS: ED Triage Vitals  Enc Vitals Group     BP 01/01/20 0424 132/76     Pulse Rate 01/01/20 0424 88     Resp 01/01/20 0424 20     Temp 01/01/20 0424 98 F (36.7 C)     Temp Source 01/01/20 0424 Oral     SpO2 01/01/20 0424 100 %     Weight 01/01/20 0421 240 lb (108.9 kg)     Height 01/01/20 0421 5\' 5"  (  1.651 m)     Head Circumference --      Peak Flow --      Pain Score 01/01/20 0421 0     Pain Loc --      Pain Edu? --      Excl. in GC? --     Constitutional: Alert and oriented. Well appearing and in no apparent distress. HEENT:      Head: Normocephalic and atraumatic.         Eyes: Conjunctivae are normal. Sclera is non-icteric.       Mouth/Throat: Mucous membranes are moist.  No angioedema, oropharynx is clear, uvula and tongue are  normal, no stridor      Neck: Supple with no signs of meningismus. Cardiovascular: Regular rate and rhythm. No murmurs, gallops, or rubs.  Respiratory: Normal respiratory effort. Lungs are clear to auscultation bilaterally.  Gastrointestinal: Soft, non tender, and non distended. Musculoskeletal:  No edema, cyanosis, or erythema of extremities. Neurologic: Normal speech and language. Face is symmetric. Moving all extremities. No gross focal neurologic deficits are appreciated. Skin: Skin is warm, dry and intact. Hives on torso Psychiatric: Mood and affect are normal. Speech and behavior are normal.  ____________________________________________   LABS (all labs ordered are listed, but only abnormal results are displayed)  Labs Reviewed - No data to display ____________________________________________  EKG  none  ____________________________________________  RADIOLOGY  none  ____________________________________________   PROCEDURES  Procedure(s) performed: None Procedures Critical Care performed:  None ____________________________________________   INITIAL IMPRESSION / ASSESSMENT AND PLAN / ED COURSE  24 y.o. female who presents for evaluation of hives.  No signs of anaphylaxis.  Recommended washing her linens and removing the fabric spray when she arrives at home.  Patient was given gloves to be able to hold the sheets with no further worsening of her allergic reaction.  Patient received IV Benadryl and Pepcid.  She was monitored for 3 hours with no progression of her symptoms.  Patient be discharged home on Benadryl and follow-up with PCP.  Discussed my standard return precautions and recommended calling 911 for any signs of anaphylaxis which were discussed with patient.       _____________________________________________ Please note:  Patient was evaluated in Emergency Department today for the symptoms described in the history of present illness. Patient was evaluated in  the context of the global COVID-19 pandemic, which necessitated consideration that the patient might be at risk for infection with the SARS-CoV-2 virus that causes COVID-19. Institutional protocols and algorithms that pertain to the evaluation of patients at risk for COVID-19 are in a state of rapid change based on information released by regulatory bodies including the CDC and federal and state organizations. These policies and algorithms were followed during the patient's care in the ED.  Some ED evaluations and interventions may be delayed as a result of limited staffing during the pandemic.   [place] Controlled Substance Database was reviewed by me. ____________________________________________   FINAL CLINICAL IMPRESSION(S) / ED DIAGNOSES   Final diagnoses:  Allergic reaction, initial encounter  Hives      NEW MEDICATIONS STARTED DURING THIS VISIT:  ED Discharge Orders    None       Note:  This document was prepared using Dragon voice recognition software and may include unintentional dictation errors.    Nita Sickle, MD 01/01/20 [phone]

## 2020-01-01 NOTE — ED Notes (Signed)
Pt verbalizes understanding of d/c instructions at this time and denies further questions.

## 2020-01-01 NOTE — ED Triage Notes (Signed)
Patient ambulatory to triage with steady gait, without difficulty or distress noted; pt reports ?allergic rx to new fabric spray she used on her bed sheets; c/o generalized hives since 130am; no meds taken PTA for relief

## 2020-06-16 ENCOUNTER — Other Ambulatory Visit: Payer: Self-pay

## 2020-06-16 ENCOUNTER — Ambulatory Visit (LOCAL_COMMUNITY_HEALTH_CENTER): Payer: Self-pay | Admitting: Nurse Practitioner

## 2020-06-16 VITALS — BP 124/78 | Ht 64.5 in | Wt 256.5 lb

## 2020-06-16 DIAGNOSIS — Z3201 Encounter for pregnancy test, result positive: Secondary | ICD-10-CM

## 2020-06-16 MED ORDER — PRENATAL VITAMINS 28-0.8 MG PO TABS: 1.0000 | ORAL_TABLET | Freq: Every day | ORAL | 0 refills | Status: DC

## 2020-06-16 NOTE — Progress Notes (Signed)
Patient in clinic today for a PT.  PT today is positive.  Patient desires to receive care with [facility] and deliver at [facility].   Glenna Fellows, RN

## 2020-06-17 LAB — PREGNANCY, URINE: Preg Test, Ur: POSITIVE — AB

## 2020-07-02 ENCOUNTER — Other Ambulatory Visit: Payer: Self-pay

## 2020-07-02 ENCOUNTER — Encounter: Payer: Self-pay | Admitting: Emergency Medicine

## 2020-07-02 ENCOUNTER — Emergency Department
Admission: EM | Admit: 2020-07-02 | Discharge: 2020-07-02 | Disposition: A | Discharge status: Home / Self Care | Payer: Medicaid Other | Attending: Emergency Medicine | Admitting: Emergency Medicine

## 2020-07-02 ENCOUNTER — Emergency Department: Payer: Medicaid Other

## 2020-07-02 DIAGNOSIS — Z3491 Encounter for supervision of normal pregnancy, unspecified, first trimester: Secondary | ICD-10-CM

## 2020-07-02 DIAGNOSIS — O418X10 Other specified disorders of amniotic fluid and membranes, first trimester, not applicable or unspecified: Secondary | ICD-10-CM

## 2020-07-02 DIAGNOSIS — O4690 Antepartum hemorrhage, unspecified, unspecified trimester: Secondary | ICD-10-CM | POA: Diagnosis not present

## 2020-07-02 DIAGNOSIS — Z3A01 Less than 8 weeks gestation of pregnancy: Secondary | ICD-10-CM | POA: Diagnosis not present

## 2020-07-02 DIAGNOSIS — O26811 Pregnancy related exhaustion and fatigue, first trimester: Secondary | ICD-10-CM | POA: Diagnosis not present

## 2020-07-02 DIAGNOSIS — R55 Syncope and collapse: Secondary | ICD-10-CM | POA: Diagnosis not present

## 2020-07-02 DIAGNOSIS — R102 Pelvic and perineal pain: Secondary | ICD-10-CM | POA: Diagnosis not present

## 2020-07-02 LAB — CBC
HCT: 41.3 % (ref 36.0–46.0)
Hemoglobin: 14 g/dL (ref 12.0–15.0)
MCH: 29 pg (ref 26.0–34.0)
MCHC: 33.9 g/dL (ref 30.0–36.0)
MCV: 85.7 fL (ref 80.0–100.0)
Platelets: 226 10*3/uL (ref 150–400)
RBC: 4.82 MIL/uL (ref 3.87–5.11)
RDW: 12.8 % (ref 11.5–15.5)
WBC: 9.5 10*3/uL (ref 4.0–10.5)
nRBC: 0 % (ref 0.0–0.2)

## 2020-07-02 LAB — BASIC METABOLIC PANEL
Anion gap: 9 (ref 5–15)
BUN: 10 mg/dL (ref 6–20)
CO2: 23 mmol/L (ref 22–32)
Calcium: 9.3 mg/dL (ref 8.9–10.3)
Chloride: 103 mmol/L (ref 98–111)
Creatinine, Ser: 0.73 mg/dL (ref 0.44–1.00)
GFR, Estimated: >60 mL/min (ref >60)
Glucose, Bld: 91 mg/dL (ref 70–99)
Potassium: 3.8 mmol/L (ref 3.5–5.1)
Sodium: 135 mmol/L (ref 135–145)

## 2020-07-02 LAB — URINALYSIS, COMPLETE (UACMP) WITH MICROSCOPIC
Bilirubin Urine: NEGATIVE
Glucose, UA: NEGATIVE mg/dL
Hgb urine dipstick: NEGATIVE
Ketones, ur: NEGATIVE mg/dL
Leukocytes,Ua: NEGATIVE
Nitrite: NEGATIVE
Protein, ur: NEGATIVE mg/dL
Specific Gravity, Urine: 1.016 (ref 1.005–1.030)
pH: 8 (ref 5.0–8.0)

## 2020-07-02 LAB — POC URINE PREG, ED: Preg Test, Ur: POSITIVE — AB

## 2020-07-02 LAB — HCG, QUANTITATIVE, PREGNANCY: hCG, Beta Chain, Quant, S: 25061 m[IU]/mL — ABNORMAL HIGH (ref <5)

## 2020-07-02 NOTE — ED Triage Notes (Signed)
Pt comes into the ED via POV c/o near syncopal episode while she was shopping in [employer] today.  Pt thinks she is roughly around [redacted] weeks pregnant but states that she has not had a confirmation [place] or OBGYN appt yet.  Pt admits to just feeling "off".  Pt ambulatory to triage at this time with even and unlabored respirations.

## 2020-07-02 NOTE — ED Notes (Signed)
Pt returned from US

## 2020-07-02 NOTE — ED Notes (Signed)
Pt presents via triage with c/o feeling extremely dizzy while at the store around 1630 today; pt reports hx of episodes of feeling light headed but dizziness today was much more severe. Pt sts "I felt like I was going to pass out." Pt currently pregnant with LNMP was in late March, but her periods were irregular.  Pt also notes pelvic pain and has had clear vaginal discharge this morning and some nausea today. Denies vaginal bleeding.  Pt reports diarrhea today; hx of IBS.

## 2020-07-02 NOTE — ED Notes (Signed)
Pt not in room at this time

## 2020-07-02 NOTE — ED Provider Notes (Addendum)
[facility] EMERGENCY DEPARTMENT Provider Note   CSN: [id number] Arrival date & time: 07/02/20  1605     History Chief Complaint  Patient presents with  . Near Syncope    Kristen Little is a 25 y.o. female presents to the emergency department for evaluation of of near syncopal episode that occurred around 4:30 PM today.  Patient states she was walking around [place] today, felt her heart rate racing, felt dizzy and lightheaded and short of breath.  Symptoms lasted just a few minutes and patient has not experienced any symptoms since.  She is [redacted] weeks pregnant.  Last menstrual period was in March.  She has not followed up with OB.  She is complaining of some pelvic cramping x1 week that comes and goes.  No chest pain, shortness of breath, lower extremity swelling.  She has had 4 pregnancies with no complications.  No history of ectopic pregnancies.  Overall very healthy with no past medical history.  No history of DVT or PE  HPI     Past Medical History:  Diagnosis Date  . Vaginal Pap smear, abnormal     Patient Active Problem List   Diagnosis Date Noted  . Migraines 07/04/2019  . Abnormal uterine bleeding 12/30/2018    Past Surgical History:  Procedure Laterality Date  . ADENOIDECTOMY W/ MYRINGOTOMY Bilateral    done as a child  . CHOLECYSTECTOMY  2021  . TONSILLECTOMY       OB History    Gravida  3   Para  1   Term  1   Preterm      AB      Living  1     SAB      IAB      Ectopic      Multiple  0   Live Births  1        Obstetric Comments  Observed for high BP- resolved, Vaginal bleeding- query post v/e, decreased Fetal movement- resolved, right side Ovarian cyst- resolved        Family History  Problem Relation Age of Onset  . Heart disease Maternal Grandmother   . Cancer Maternal Grandmother   . Diabetes Maternal Grandmother   . Cancer Maternal Grandfather   . Hepatitis C Paternal Grandmother     Social History    Tobacco Use  . Smoking status: Never Smoker  . Smokeless tobacco: Never Used  Substance Use Topics  . Alcohol use: No  . Drug use: No    Home Medications Prior to Admission medications   Medication Sig Start Date End Date Taking? Authorizing Provider  ibuprofen (ADVIL,MOTRIN) 600 MG tablet Take 1 tablet (600 mg total) by mouth every 6 (six) hours. Patient not taking: No sig reported 06/12/15   Marta Antu, CNM  metoCLOPramide (REGLAN) 10 MG tablet Take 1 tablet (10 mg total) by mouth every 6 (six) hours as needed for nausea. 04/25/17 04/25/18  Willy Eddy, MD  Multiple Vitamins-Minerals (MULTIVITAMIN) tablet Take 1 tablet by mouth daily. Patient not taking: Reported on 06/16/2020 07/04/19   Federico Flake, MD  norethindrone (MICRONOR) 0.35 MG tablet Take 1 tablet (0.35 mg total) by mouth daily. Patient not taking: Reported on 06/16/2020 07/04/19   Federico Flake, MD  oxyCODONE (OXY IR/ROXICODONE) 5 MG immediate release tablet Take 1 tablet (5 mg total) by mouth every 4 (four) hours as needed (pain scale 4-7). Patient not taking: No sig reported 06/12/15   Marta Antu, CNM  Prenatal Vit-Fe Fumarate-FA (PRENATAL MULTIVITAMIN) TABS tablet Take 1 tablet by mouth daily at 12 noon. Patient not taking: Reported on 06/16/2020    [provider]  Prenatal Vit-Fe Fumarate-FA (PRENATAL VITAMINS) 28-0.8 MG TABS Take 1 tablet by mouth daily. 06/16/20   Federico Flake, MD    Allergies    Azithromycin and Penicillins  Review of Systems   Review of Systems  Constitutional: Negative for chills, fatigue and fever.  HENT: Negative for congestion and ear pain.   Respiratory: Negative for cough, shortness of breath and stridor.   Cardiovascular: Negative for chest pain and leg swelling.  Gastrointestinal: Negative for diarrhea, nausea and vomiting.  Genitourinary: Positive for pelvic pain (cramping). Negative for difficulty urinating, dysuria, flank pain, vaginal  bleeding and vaginal pain.  Musculoskeletal: Negative for back pain and neck pain.  Skin: Negative for rash.  Neurological: Positive for light-headedness. Negative for dizziness and syncope.    Physical Exam Updated Vital Signs BP 125/80   Pulse 92   Temp 98.6 F (37 C) (Oral)   Resp 18   Ht 5\' 4"  (1.626 m)   Wt 116.1 kg   LMP 05/06/2020   SpO2 100%   BMI 43.94 kg/m   Physical Exam Constitutional:      Appearance: She is well-developed.  HENT:     Head: Normocephalic and atraumatic.     Right Ear: Tympanic membrane, ear canal and external ear normal.     Left Ear: Tympanic membrane, ear canal and external ear normal.     Ears:     Comments: Clear fluid behind the left TM with no signs of infection.    Mouth/Throat:     Mouth: Mucous membranes are moist.     Pharynx: No oropharyngeal exudate or posterior oropharyngeal erythema.  Eyes:     Conjunctiva/sclera: Conjunctivae normal.  Cardiovascular:     Rate and Rhythm: Normal rate.  Pulmonary:     Effort: Pulmonary effort is normal. No respiratory distress.     Breath sounds: Normal breath sounds.  Abdominal:     General: Abdomen is flat. Bowel sounds are normal. There is no distension.     Palpations: Abdomen is soft. There is no mass.     Tenderness: There is no abdominal tenderness. There is no guarding.     Comments: Minimal pelvic tenderness to palpation.  Abdomen pelvis is soft.  Musculoskeletal:        General: No swelling or tenderness. Normal range of motion.     Cervical back: Normal range of motion.  Skin:    General: Skin is warm.     Findings: No rash.  Neurological:     General: No focal deficit present.     Mental Status: She is alert and oriented to person, place, and time.     Cranial Nerves: No cranial nerve deficit.     Motor: No weakness.     Coordination: Coordination normal.     Gait: Gait normal.  Psychiatric:        Mood and Affect: Mood normal.        Behavior: Behavior normal.         Thought Content: Thought content normal.     ED Results / Procedures / Treatments   Labs (all labs ordered are listed, but only abnormal results are displayed) Labs Reviewed  URINALYSIS, COMPLETE (UACMP) WITH MICROSCOPIC - Abnormal; Notable for the following components:      Result Value   Color, Urine YELLOW (*)  APPearance CLEAR (*)    Bacteria, UA RARE (*)    All other components within normal limits  HCG, QUANTITATIVE, PREGNANCY - Abnormal; Notable for the following components:   hCG, Beta Chain, Quant, S 25,061 (*)    All other components within normal limits  POC URINE PREG, ED - Abnormal; Notable for the following components:   Preg Test, Ur POSITIVE (*)    All other components within normal limits  BASIC METABOLIC PANEL  CBC    EKG None  Radiology US OB LESS THAN 14 WEEKS WITH OB TRANSVAGINAL  Result Date: 07/02/2020 CLINICAL DATA:  Pregnant patient in first-trimester pregnancy with cramping. Gestational age by LMP 7 weeks 4 days. EXAM: OBSTETRIC <14 WK [place] AND TRANSVAGINAL OB US TECHNIQUE: Both transabdominal and transvaginal ultrasound examinations were performed for complete evaluation of the gestation as well as the maternal uterus, adnexal regions, and pelvic cul-de-sac. Transvaginal technique was performed to assess early pregnancy. COMPARISON:  None this pregnancy. FINDINGS: Intrauterine gestational sac: Single Yolk sac:  Visualized. Embryo:  Visualized. Cardiac Activity: Visualized. Heart Rate: 112 bpm CRL: 5.1 mm   6 w   2 d                  [place] EDC: 02/23/2021 Subchorionic hemorrhage: Small to moderate measuring 2.2 x 1.7 x 1.6 cm lateral to the gestational sac. Maternal uterus/adnexae: Right ovary is normal in size and contains a small cyst. There is a corpus luteal cyst in the left ovary measuring 2 cm. No adnexal mass. No pelvic free fluid. IMPRESSION: 1. Single live intrauterine gestation estimated gestational age [redacted] weeks 2 days based on crown-rump length for  ultrasound [facility] 02/23/2021. 2. Small to moderate subchorionic hemorrhage. Electronically Signed   By: Narda Rutherford M.D.   On: 07/02/2020 22:17    Procedures Procedures   Medications Ordered in ED Medications - No data to display  ED Course  I have reviewed the triage vital signs and the nursing notes.  Pertinent labs & imaging results that were available during my care of the patient were reviewed by me and considered in my medical decision making (see chart for details).    MDM Rules/Calculators/A&P                          25 year old female [redacted] weeks pregnant.  Presents with 3-minute episode of feeling some palpitations, shortness of breath and feeling lightheaded.  Symptoms lasted for few minutes.  Symptoms have resolved and has not been present since 4:30 PM today.  Vital signs are stable.  EKG normal.  Heart regular rate and rhythm.  She was having some mild pelvic pain and cramping.  Urinalysis negative, ultrasound obtained showing normal intrauterine pregnancy 6 weeks and 2 days with a small subchorionic hemorrhage.  Patient understands ultrasound findings.  She will follow-up with her OB doctor.  She understands signs symptoms return to the ER for. Final Clinical Impression(s) / ED Diagnoses Final diagnoses:  Pelvic pain  Near syncope  First trimester pregnancy  Subchorionic hemorrhage of placenta in first trimester, single or unspecified fetus    Rx / DC Orders ED Discharge Orders    None       Evon Slack, PA-C 07/02/20 2246    Evon Slack, PA-C 07/02/20 2246    Phineas Semen, MD 07/02/20 [phone]

## 2020-07-02 NOTE — ED Notes (Signed)
HCG added on in lab.

## 2020-07-02 NOTE — Discharge Instructions (Addendum)
Please continue to take prenatal vitamins. Make sure you are staying hydrated. Follow-up with OB physician.  Return to the ER for any dizziness, lightheadedness, chest pain, shortness of breath or palpitations.

## 2020-08-06 ENCOUNTER — Encounter: Payer: Self-pay | Admitting: Family Medicine

## 2020-08-06 ENCOUNTER — Other Ambulatory Visit: Payer: Self-pay

## 2020-08-06 ENCOUNTER — Ambulatory Visit: Payer: Medicaid Other | Admitting: Family Medicine

## 2020-08-06 VITALS — BP 125/80 | HR 88 | Temp 97.8°F | Wt 256.2 lb

## 2020-08-06 DIAGNOSIS — O99210 Obesity complicating pregnancy, unspecified trimester: Secondary | ICD-10-CM

## 2020-08-06 DIAGNOSIS — O0991 Supervision of high risk pregnancy, unspecified, first trimester: Secondary | ICD-10-CM

## 2020-08-06 DIAGNOSIS — O99211 Obesity complicating pregnancy, first trimester: Secondary | ICD-10-CM

## 2020-08-06 DIAGNOSIS — O0990 Supervision of high risk pregnancy, unspecified, unspecified trimester: Secondary | ICD-10-CM | POA: Insufficient documentation

## 2020-08-06 LAB — URINALYSIS
Bilirubin, UA: NEGATIVE
Glucose, UA: NEGATIVE
Ketones, UA: NEGATIVE
Leukocytes,UA: NEGATIVE
Nitrite, UA: NEGATIVE
Protein,UA: NEGATIVE
RBC, UA: NEGATIVE
Specific Gravity, UA: 1.02 (ref 1.005–1.030)
Urobilinogen, Ur: 0.2 mg/dL (ref 0.2–1.0)
pH, UA: 6.5 (ref 5.0–7.5)

## 2020-08-06 LAB — HEMOGLOBIN, FINGERSTICK: Hemoglobin: 13.8 g/dL (ref 11.1–15.9)

## 2020-08-06 NOTE — Progress Notes (Signed)
Patient here for new OB at about 13 1/7. States she has long term covid issue--states food smells like sewage, states she is seeing improvements. Concerns about very painful and nipples look different. Had some issues with 3 child and breastfeeding, has some questions. Needs BMI labs.Burt Knack, RN

## 2020-08-06 NOTE — Progress Notes (Signed)
[facility] HEALTH DEPT [facility] [address] [place] [address] [phone]  INITIAL PRENATAL VISIT NOTE  Subjective:  Kristen Little is a 25 y.o. [phone] at [redacted]w[redacted]d being seen today to start prenatal care at the [facility] Department.  She is currently monitored for the following issues for this high-risk pregnancy and has Abnormal uterine bleeding; Migraines; and Pregnancy, supervision for, high-risk, unspecified trimester on their problem list.  Patient reports no complaints.  Contractions: Not present. Vag. Bleeding: None.  Movement: Absent. Denies leaking of fluid.   Indications for ASA therapy (per uptodate) One of the following: Previous pregnancy with preeclampsia, especially early onset and with an adverse outcome No Multifetal gestation No Chronic hypertension No Type 1 or 2 diabetes mellitus No Chronic kidney disease No Autoimmune disease (antiphospholipid syndrome, systemic lupus erythematosus) No   Two or more of the following: Nulliparity No Obesity (body mass index >30 kg/m2) Yes Family history of preeclampsia in mother or sister No Age ?35 years No Sociodemographic characteristics (African American race, low socioeconomic level) Yes Personal risk factors (eg, previous pregnancy with low birth weight or small for gestational age infant, previous adverse pregnancy outcome [eg, stillbirth], interval >10 years between pregnancies) No   The following portions of the patient's history were reviewed and updated as appropriate: allergies, current medications, past family history, past medical history, past social history, past surgical history and problem list. Problem list updated.  Objective:   Vitals:   08/06/20 0934  BP: 125/80  Pulse: 88  Temp: 97.8 F (36.6 C)  Weight: 256 lb 3.2 oz (116.2 kg)    Fetal Status:   Fundal Height: 11 cm Movement: Absent  Presentation: Undeterminable   Physical  Exam Vitals and nursing note reviewed.  Constitutional:      General: She is not in acute distress.    Appearance: Normal appearance. She is well-developed. She is obese.  HENT:     Head: Normocephalic and atraumatic.     Right Ear: External ear normal.     Left Ear: External ear normal.     Nose: Nose normal. No congestion or rhinorrhea.     Mouth/Throat:     Lips: Pink.     Mouth: Mucous membranes are moist.     Dentition: Normal dentition. No dental caries.     Pharynx: Oropharynx is clear. Uvula midline.     Comments: Dentition: no visible caries  Eyes:     General: No scleral icterus.    Conjunctiva/sclera: Conjunctivae normal.  Neck:     Thyroid: No thyroid mass or thyromegaly.  Cardiovascular:     Rate and Rhythm: Normal rate.     Pulses: Normal pulses.     Comments: Extremities are warm and well perfused Pulmonary:     Effort: Pulmonary effort is normal.     Breath sounds: Normal breath sounds.  Chest:     Chest wall: No mass.  Breasts:    Tanner Score is 5.     Breasts are symmetrical.     Right: Normal. No mass, nipple discharge, skin change or axillary adenopathy.     Left: Normal. No mass, nipple discharge, skin change or axillary adenopathy.     Comments: Breasts:        Right: Normal. No swelling, mass, nipple discharge, skin change or tenderness.        Left: Normal. No swelling, mass, nipple discharge, skin change or tenderness.   Abdominal:  General: Abdomen is flat.     Palpations: Abdomen is soft.     Tenderness: There is no abdominal tenderness.     Comments: Gravid   Genitourinary:    General: Normal vulva.     Exam position: Lithotomy position.     Pubic Area: No rash.      Labia:        Right: No rash.        Left: No rash.      Vagina: Normal. No vaginal discharge.     Cervix: No cervical motion tenderness or friability.     Uterus: Normal. Enlarged (Gravid 11 size). Not tender.      Adnexa: Right adnexa normal and left adnexa normal.      Rectum: Normal. No external hemorrhoid.     Comments: External genitalia without, lice, nits, erythema, edema , lesions or inguinal adenopathy. Vagina with normal mucosa and white discharge and pH equals 4.  Cervix without visual lesions, uterus firm, mobile, non-tender, no masses, CMT adnexal fullness or tenderness.   Musculoskeletal:     Right lower leg: No edema.     Left lower leg: No edema.  Lymphadenopathy:     Cervical: No cervical adenopathy.     Upper Body:     Right upper body: No axillary adenopathy.     Left upper body: No axillary adenopathy.  Skin:    General: Skin is warm.     Capillary Refill: Capillary refill takes less than 2 seconds.  Neurological:     Mental Status: She is alert and oriented to person, place, and time.  Psychiatric:        Behavior: Behavior normal.    Assessment and Plan:  Pregnancy: G4P3003 at [redacted]w[redacted]d  1. Pregnancy, supervision for, high-risk, unspecified trimester  - Dating:  LMP 04/16/20, had [place] on 07/02/20- 6w2 days   - Genetic screening: desires, referral placed  - Pregnancy sx: Denies N/V except for with certain smells- counseling given if it developed  - Last dental care, ~ 5 years ago.  Reviewed safety of routine care in pregnancy  - Has support system that is involved (FOB & 3 daughters) - Routine labs today  - Vaccinations: declined covid vaccines "this time"  - Has two minor risk factors for preeclampsia. Recommended ASA 81mg  daily for preeclampsia prevention. Discussed starting at 11-13 weeks and continuing through pregnancy. We will touch base at next appointment to see if she started this medication.   - Chlamydia/GC NAA, Confirmation - Glucose tolerance, 1 hour - HCV Ab w Reflex to Quant PCR - Hgb A1c w/o eAG - HIV-1/HIV-2 Qualitative RNA - Lead, blood (adult age 77 yrs or greater) - Prenatal profile without Varicella or Rubella - Protein / creatinine ratio, urine - TSH - Urine Culture - Comprehensive metabolic panel -  WET PREP FOR TRICH, YEAST, CLUE - Hemoglobin, venipuncture - Urinalysis (Urine Dip) 12 7+Oxycodone-Bund  2. Obesity in pregnancy, antepartum Discussed exercise while pregnant- walking and YouTube videos.   - Amb ref to Medical Nutrition Therapy-MNT   Discussed overview of care and coordination with inpatient delivery practices including [facility], - [id number], [facility] and [facility].      Preterm labor symptoms and general obstetric precautions including but not limited to vaginal bleeding, contractions, leaking of fluid and fetal movement were reviewed in detail with the patient.  Please refer to After Visit Summary for other counseling recommendations.   Return in about 4 weeks (around 09/03/2020) for routine prenatal care.  Future Appointments  Date Time Provider Department Center  09/03/2020  4:00 PM [facility] PROVIDER [facility] None    Wendi Snipes, FNP

## 2020-08-06 NOTE — Progress Notes (Signed)
Hgb, urine dip, and wet prep reviewed with provider, Elveria Rising, FNP during clinic visit.

## 2020-08-07 LAB — CBC/D/PLT+RPR+RH+ABO+AB SCR
Antibody Screen: NEGATIVE
Basophils Absolute: 0 10*3/uL (ref 0.0–0.2)
Basos: 0 %
EOS (ABSOLUTE): 0.1 10*3/uL (ref 0.0–0.4)
Eos: 1 %
Hematocrit: 41.4 % (ref 34.0–46.6)
Hemoglobin: 13.8 g/dL (ref 11.1–15.9)
Hepatitis B Surface Ag: NEGATIVE
Immature Grans (Abs): 0 10*3/uL (ref 0.0–0.1)
Immature Granulocytes: 0 %
Lymphocytes Absolute: 1.8 10*3/uL (ref 0.7–3.1)
Lymphs: 24 %
MCH: 29.2 pg (ref 26.6–33.0)
MCHC: 33.3 g/dL (ref 31.5–35.7)
MCV: 88 fL (ref 79–97)
Monocytes Absolute: 0.4 10*3/uL (ref 0.1–0.9)
Monocytes: 5 %
Neutrophils Absolute: 4.9 10*3/uL (ref 1.4–7.0)
Neutrophils: 70 %
Platelets: 204 10*3/uL (ref 150–450)
RBC: 4.73 x10E6/uL (ref 3.77–5.28)
RDW: 13.1 % (ref 11.7–15.4)
RPR Ser Ql: NONREACTIVE
Rh Factor: POSITIVE
WBC: 7.2 10*3/uL (ref 3.4–10.8)

## 2020-08-07 LAB — HCV INTERPRETATION

## 2020-08-07 LAB — HCV AB W REFLEX TO QUANT PCR: HCV Ab: <0.1 s/co ratio (ref 0.0–0.9)

## 2020-08-08 LAB — CHLAMYDIA/GC NAA, CONFIRMATION
Chlamydia trachomatis, NAA: NEGATIVE
Neisseria gonorrhoeae, NAA: NEGATIVE

## 2020-08-09 ENCOUNTER — Telehealth: Payer: Self-pay

## 2020-08-09 DIAGNOSIS — O99810 Abnormal glucose complicating pregnancy: Secondary | ICD-10-CM | POA: Insufficient documentation

## 2020-08-09 LAB — GLUCOSE, 1 HOUR GESTATIONAL: Gestational Diabetes Screen: 161 mg/dL — ABNORMAL HIGH (ref 65–139)

## 2020-08-09 LAB — COMPREHENSIVE METABOLIC PANEL
ALT: 12 IU/L (ref 0–32)
AST: 12 IU/L (ref 0–40)
Albumin/Globulin Ratio: 1.6 (ref 1.2–2.2)
Albumin: 4.3 g/dL (ref 3.9–5.0)
Alkaline Phosphatase: 67 IU/L (ref 44–121)
BUN/Creatinine Ratio: 9 (ref 9–23)
BUN: 6 mg/dL (ref 6–20)
Bilirubin Total: 0.2 mg/dL (ref 0.0–1.2)
CO2: 17 mmol/L — ABNORMAL LOW (ref 20–29)
Calcium: 9.1 mg/dL (ref 8.7–10.2)
Chloride: 98 mmol/L (ref 96–106)
Creatinine, Ser: 0.68 mg/dL (ref 0.57–1.00)
Globulin, Total: 2.7 g/dL (ref 1.5–4.5)
Glucose: 165 mg/dL — ABNORMAL HIGH (ref 65–99)
Potassium: 3.7 mmol/L (ref 3.5–5.2)
Sodium: 142 mmol/L (ref 134–144)
Total Protein: 7 g/dL (ref 6.0–8.5)
eGFR: 125 mL/min/{1.73_m2} (ref >59)

## 2020-08-09 LAB — TSH: TSH: 2.25 u[IU]/mL (ref 0.450–4.500)

## 2020-08-09 LAB — HIV-1/HIV-2 QUALITATIVE RNA
HIV-1 RNA, Qualitative: NONREACTIVE
HIV-2 RNA, Qualitative: NONREACTIVE

## 2020-08-09 LAB — HGB A1C W/O EAG: Hgb A1c MFr Bld: 5.2 % (ref 4.8–5.6)

## 2020-08-09 LAB — LEAD, BLOOD (ADULT >= 16 YRS): Lead-Whole Blood: <1 ug/dL (ref 0–4)

## 2020-08-09 NOTE — Telephone Encounter (Signed)
TC to patient to inform of elevated 1 hour glucose test results. (161). Patient counseled about need for 3 hour test. Patient states she has done the 3 hour test in previous pregnancy. Fasting procedure explained and 3 hour gtt scheduled for 08/10/20 and patient told to arrive 0800. Patient states understanding of instructions about fasting and test. .Burt Knack, RN

## 2020-08-10 ENCOUNTER — Other Ambulatory Visit: Payer: Medicaid Other

## 2020-08-10 ENCOUNTER — Other Ambulatory Visit: Payer: Self-pay

## 2020-08-10 DIAGNOSIS — O99810 Abnormal glucose complicating pregnancy: Secondary | ICD-10-CM

## 2020-08-10 NOTE — Progress Notes (Signed)
In Nurse Clinic for 3 hr GTT. Reports npo since 7:30 pm last night (08/09/2020.) Instructions explained for test today. Reports understanding. Next [facility] appt 09/03/2020, arrival 3:45 pm. Pt aware. Jerel Shepherd, RN

## 2020-08-11 ENCOUNTER — Encounter: Payer: Self-pay | Admitting: Family Medicine

## 2020-08-11 DIAGNOSIS — O24419 Gestational diabetes mellitus in pregnancy, unspecified control: Secondary | ICD-10-CM | POA: Insufficient documentation

## 2020-08-11 LAB — GLUCOSE TOLERANCE TEST, 6 HOUR
Glucose, 1 Hour GTT: 192 mg/dL (ref 65–199)
Glucose, 2 hour: 169 mg/dL — ABNORMAL HIGH (ref 65–139)
Glucose, 3 hour: 161 mg/dL — ABNORMAL HIGH (ref 65–109)
Glucose, GTT - Fasting: 80 mg/dL (ref 65–99)

## 2020-08-12 NOTE — Progress Notes (Signed)
Opened in error.Burt Knack, RN

## 2020-08-12 NOTE — Progress Notes (Signed)
[facility] MFM referral for new GDM faxed with confirmation.Burt Knack, RN

## 2020-08-13 ENCOUNTER — Telehealth: Payer: Self-pay

## 2020-08-13 ENCOUNTER — Encounter: Payer: Self-pay | Admitting: Family Medicine

## 2020-08-13 ENCOUNTER — Telehealth: Payer: Self-pay | Admitting: Advanced Practice Midwife

## 2020-08-13 ENCOUNTER — Other Ambulatory Visit: Payer: Self-pay | Admitting: Advanced Practice Midwife

## 2020-08-13 ENCOUNTER — Encounter: Payer: Self-pay | Admitting: Advanced Practice Midwife

## 2020-08-13 ENCOUNTER — Telehealth: Payer: Self-pay | Admitting: Family Medicine

## 2020-08-13 DIAGNOSIS — O2340 Unspecified infection of urinary tract in pregnancy, unspecified trimester: Secondary | ICD-10-CM

## 2020-08-13 LAB — PROTEIN / CREATININE RATIO, URINE
Creatinine, Urine: 187.4 mg/dL
Protein, Ur: 26 mg/dL
Protein/Creat Ratio: 139 mg/g creat (ref 0–200)

## 2020-08-13 LAB — 789231 7+OXYCODONE-BUND
Amphetamines, Urine: NEGATIVE ng/mL
BENZODIAZ UR QL: NEGATIVE ng/mL
Barbiturate screen, urine: NEGATIVE ng/mL
Cannabinoid Quant, Ur: NEGATIVE ng/mL
Cocaine (Metab.): NEGATIVE ng/mL
OPIATE SCREEN URINE: NEGATIVE ng/mL
Oxycodone/Oxymorphone, Urine: NEGATIVE ng/mL
PCP Quant, Ur: NEGATIVE ng/mL

## 2020-08-13 LAB — URINE CULTURE

## 2020-08-13 MED ORDER — NITROFURANTOIN MONOHYD MACRO 100 MG PO CAPS: 100.0000 mg | ORAL_CAPSULE | Freq: Two times a day (BID) | ORAL | 0 refills | Status: DC

## 2020-08-13 NOTE — Telephone Encounter (Signed)
Please see my t/c note with pt on 08/13/20

## 2020-08-13 NOTE — Telephone Encounter (Signed)
Returned pt's T/C stating she received a call from [facility] about a gestational diabetic apt on 08/31/20. Discussed abnormal 3 hour GTT on 08/10/20 and potential implications to pt and fetus, plan of care, need for compliance with diet, exercise, blood sugar log, weekly apts, etc. Questions answered.

## 2020-08-13 NOTE — Telephone Encounter (Signed)
Pt states that [facility] called her and said she was referred to them for gestational diabetes. She states that no one has called her from [facility] and she would like to speak to a nurse or provider about her results from the 3 hr Gtt.

## 2020-08-13 NOTE — Telephone Encounter (Signed)
Contacted patient regarding UTI and eRX of Macrobid sent to her pharmacy. Educated patient to take antibiotic BID and to finish the full treatment. Patient states she has no s/s. Educated about the importance of taking the full 7 day course. If she does have s/s after taking course to give [place] a call. Otherwise patient was informed we will TOC next MH RV.   Floy Sabina, RN

## 2020-08-17 LAB — WET PREP FOR TRICH, YEAST, CLUE
Trichomonas Exam: NEGATIVE
Yeast Exam: NEGATIVE

## 2020-08-18 ENCOUNTER — Encounter: Payer: Self-pay | Admitting: Advanced Practice Midwife

## 2020-08-18 DIAGNOSIS — O24419 Gestational diabetes mellitus in pregnancy, unspecified control: Secondary | ICD-10-CM

## 2020-08-19 ENCOUNTER — Telehealth: Payer: Self-pay | Admitting: Advanced Practice Midwife

## 2020-08-19 NOTE — Telephone Encounter (Signed)
T/C to pt to discuss need to transfer care to [facility] because she was 11 wks at her 3 hour GTT and she is therefore too high risk to receive care at [facility]. Pt wants to keep her [facility] diabetic education apt on 08/31/20 but has decided it's too far to go to [facility] for prenatal care so wants to change to [facility] for her prenatal care this pregnancy. Referral written for [facility] transfer of care

## 2020-08-19 NOTE — Progress Notes (Signed)
Referral for transfer of care faxed to [facility] with records. Confirmation received.Burt Knack, RN

## 2020-08-20 NOTE — Telephone Encounter (Signed)
Call to [facility] to notify them to disregard [facility] referral form for transfer of care from 08/19/2020 as client plans transfer of care to [facility]. RN spoke with receptionist Alejandra during call. Alejandra aware client plans to keep 08/31/2020 Diabetes appt as previously scheduled. Jossie Ng, RN

## 2020-08-23 ENCOUNTER — Telehealth: Payer: Self-pay | Admitting: Family Medicine

## 2020-08-23 NOTE — Telephone Encounter (Signed)
Call to client and left message that records were faxed to [facility] on 08/19/2020, but takes a few days for their nurse / provider to review and have appt scheduled. Also assured client we have her in our nurse tracking system to ensure appt scheduled and will call [facility] tomorrow to see if appt scheduled and call her back. Jossie Ng, RN

## 2020-08-23 NOTE — Telephone Encounter (Signed)
Patient stated she was referred to [facility] due to Gestational Diabetes.  Would like her records faxed to [facility].  Fax: [phone]

## 2020-08-24 NOTE — Telephone Encounter (Signed)
Call to [facility] to ascertain if transfer of care appt has been scheduled for client (referral / records faxed 08/19/2020). App has been scheduled for 09/15/2020 at 1030. Call to client and she is aware of [facility] appt. Jossie Ng, RN

## 2020-08-30 ENCOUNTER — Telehealth: Payer: Self-pay | Admitting: Family Medicine

## 2020-08-30 ENCOUNTER — Emergency Department
Admission: EM | Admit: 2020-08-30 | Discharge: 2020-08-30 | Disposition: A | Discharge status: Home / Self Care | Payer: Medicaid Other | Attending: Emergency Medicine | Admitting: Emergency Medicine

## 2020-08-30 ENCOUNTER — Emergency Department: Payer: Medicaid Other

## 2020-08-30 ENCOUNTER — Other Ambulatory Visit: Payer: Self-pay

## 2020-08-30 DIAGNOSIS — O2312 Infections of bladder in pregnancy, second trimester: Secondary | ICD-10-CM | POA: Insufficient documentation

## 2020-08-30 DIAGNOSIS — Z3A15 15 weeks gestation of pregnancy: Secondary | ICD-10-CM | POA: Insufficient documentation

## 2020-08-30 DIAGNOSIS — O4290 Premature rupture of membranes, unspecified as to length of time between rupture and onset of labor, unspecified weeks of gestation: Secondary | ICD-10-CM

## 2020-08-30 DIAGNOSIS — B9689 Other specified bacterial agents as the cause of diseases classified elsewhere: Secondary | ICD-10-CM | POA: Insufficient documentation

## 2020-08-30 DIAGNOSIS — O24419 Gestational diabetes mellitus in pregnancy, unspecified control: Secondary | ICD-10-CM | POA: Diagnosis not present

## 2020-08-30 DIAGNOSIS — O23592 Infection of other part of genital tract in pregnancy, second trimester: Secondary | ICD-10-CM | POA: Diagnosis present

## 2020-08-30 LAB — WET PREP, GENITAL
Clue Cells Wet Prep HPF POC: NONE SEEN
Sperm: NONE SEEN
Trich, Wet Prep: NONE SEEN
Yeast Wet Prep HPF POC: NONE SEEN

## 2020-08-30 LAB — URINALYSIS, COMPLETE (UACMP) WITH MICROSCOPIC
Bilirubin Urine: NEGATIVE
Glucose, UA: NEGATIVE mg/dL
Hgb urine dipstick: NEGATIVE
Ketones, ur: 80 mg/dL — AB
Nitrite: NEGATIVE
Protein, ur: NEGATIVE mg/dL
Specific Gravity, Urine: 1.009 (ref 1.005–1.030)
pH: 6 (ref 5.0–8.0)

## 2020-08-30 LAB — CHLAMYDIA/NGC RT PCR (ARMC ONLY)
Chlamydia Tr: NOT DETECTED
N gonorrhoeae: NOT DETECTED

## 2020-08-30 MED ORDER — LACTATED RINGERS IV BOLUS
1000.0000 mL | Freq: Once | INTRAVENOUS | Status: AC
  Administered 2020-08-30: 1000 mL via INTRAVENOUS

## 2020-08-30 MED ORDER — CEPHALEXIN 500 MG PO CAPS: 500.0000 mg | ORAL_CAPSULE | Freq: Two times a day (BID) | ORAL | 0 refills | Status: AC

## 2020-08-30 NOTE — Telephone Encounter (Signed)
EGA = 14 5/7 and has GDM. Call to client who reports 4 day history of intermittent wetness in crotch of panties that is not urine or discharge. Reports wetness increased today and couple of times crotch has been saturated. Denies change in previously reported crotch discomfort, denies itching or burning. Consult with Hazle Coca CNM and client counseled to seek ED evaluation of above. Jossie Ng, RN

## 2020-08-30 NOTE — Discharge Instructions (Addendum)
Ultrasound shows that you are 15 weeks and 1 day. Estimated due date is January 11.   You have been prescribed Keflex. It is possible that your urinary tract infection did not completely clear.  Your vaginal swabs are all negative for infection.

## 2020-08-30 NOTE — ED Provider Notes (Signed)
[facility] Emergency Department Provider Note  ____________________________________________  Time seen: Approximately 6:16 PM  I have reviewed the triage vital signs and the nursing notes.   HISTORY  Chief Complaint UTI and Leaking fluid    HPI Kristen Little is a 25 y.o. female G4/P3 at about 14 weeks presents to the emergency department for treatment and evaluation of fluid leaking and malodorous vaginal discharge. She states that she was cleaning out her closet earlier today and felt that her underwear were wet. She had some issues with urinary leakage with her last 2 pregnancies, but it was later on when she was bigger. She also states that she has been on a low carb diet as recommended by her gynecologist at the health department and believes that is causing the foul odor that she notices when she takes off her underwear. She was recently treated for a UTI and has completed the course of antibiotic.   Past Medical History:  Diagnosis Date   Irritable bowel syndrome (IBS)    Medical history non-contributory    Ovarian cyst    Vaginal Pap smear, abnormal     Patient Active Problem List   Diagnosis Date Noted   UTI (urinary tract infection) during pregnancy 08/06/20 50-100,000 Enterococcus faecalis 08/13/2020   Obesity in pregnancy BMI=43.9 08/13/2020   DM (diabetes mellitus), gestational 08/10/20 @ 11 6/7 wks 08/11/2020   Abnormal glucose affecting pregnancy 08/09/2020   Pregnancy, supervision for, high-risk, unspecified trimester 08/06/2020   Migraines 07/04/2019   Abnormal uterine bleeding 12/30/2018    Past Surgical History:  Procedure Laterality Date   ADENOIDECTOMY W/ MYRINGOTOMY Bilateral    done as a child   CHOLECYSTECTOMY  05/27/2019   TONSILLECTOMY      Prior to Admission medications   Medication Sig Start Date End Date Taking? Authorizing Provider  cephALEXin (KEFLEX) 500 MG capsule Take 1 capsule (500 mg total) by mouth 2 (two) times  daily for 7 days. 08/30/20 09/06/20 Yes Nicandro Perrault B, FNP  ibuprofen (ADVIL,MOTRIN) 600 MG tablet Take 1 tablet (600 mg total) by mouth every 6 (six) hours. Patient not taking: No sig reported 06/12/15   Marta Antu, CNM  metoCLOPramide (REGLAN) 10 MG tablet Take 1 tablet (10 mg total) by mouth every 6 (six) hours as needed for nausea. 04/25/17 04/25/18  Willy Eddy, MD  Multiple Vitamins-Minerals (MULTIVITAMIN) tablet Take 1 tablet by mouth daily. Patient not taking: Reported on 06/16/2020 07/04/19   Federico Flake, MD  nitrofurantoin, macrocrystal-monohydrate, (MACROBID) 100 MG capsule Take 1 capsule (100 mg total) by mouth 2 (two) times daily. 08/13/20   Sciora, Austin Miles, CNM  norethindrone (MICRONOR) 0.35 MG tablet Take 1 tablet (0.35 mg total) by mouth daily. Patient not taking: Reported on 06/16/2020 07/04/19   Federico Flake, MD  oxyCODONE (OXY IR/ROXICODONE) 5 MG immediate release tablet Take 1 tablet (5 mg total) by mouth every 4 (four) hours as needed (pain scale 4-7). Patient not taking: No sig reported 06/12/15   Marta Antu, CNM  Prenatal Vit-Fe Fumarate-FA (PRENATAL MULTIVITAMIN) TABS tablet Take 1 tablet by mouth daily at 12 noon. Patient not taking: No sig reported    [provider]  Prenatal Vit-Fe Fumarate-FA (PRENATAL VITAMINS) 28-0.8 MG TABS Take 1 tablet by mouth daily. 06/16/20   Federico Flake, MD    Allergies Azithromycin and Penicillins  Family History  Problem Relation Age of Onset   Heart disease Maternal Grandmother    Cancer Maternal Grandmother  Diabetes Maternal Grandmother    Cancer Maternal Grandfather    Hepatitis C Paternal Grandmother     Social History Social History   Tobacco Use   Smoking status: Never   Smokeless tobacco: Never   Tobacco comments:    Denies secondhand smoke.  Vaping Use   Vaping Use: Never used  Substance Use Topics   Alcohol use: No   Drug use: No    Review of  Systems Constitutional: Negative for fever. Respiratory: Negative for shortness of breath or cough. Gastrointestinal: Positive for abdominal pain; negative for nausea , negative for vomiting. Genitourinary: Negative for dysuria , positive for vaginal discharge. Musculoskeletal: Negative for back pain. Skin: Negative for acute skin changes/rash/lesion. ____________________________________________   PHYSICAL EXAM:  VITAL SIGNS: ED Triage Vitals  Enc Vitals Group     BP 08/30/20 1703 (!) 143/92     Pulse Rate 08/30/20 1703 (!) 106     Resp 08/30/20 1703 18     Temp 08/30/20 1703 99 F (37.2 C)     Temp Source 08/30/20 1703 Oral     SpO2 08/30/20 1703 100 %     Weight --      Height --      Head Circumference --      Peak Flow --      Pain Score 08/30/20 1708 0     Pain Loc --      Pain Edu? --      Excl. in GC? --     Constitutional: Alert and oriented. Well appearing and in no acute distress. Eyes: Conjunctivae are normal. Head: Atraumatic. Nose: No congestion/rhinnorhea. Mouth/Throat: Mucous membranes are moist. Respiratory: Normal respiratory effort.  No retractions. Gastrointestinal: Bowel sounds active x 4; Abdomen is soft without rebound or guarding. Genitourinary: Pelvic exam: light yellow discharge from cervical os noted. No CMT. Musculoskeletal: No extremity tenderness nor edema.  Neurologic:  Normal speech and language. No gross focal neurologic deficits are appreciated. Speech is normal. No gait instability. Skin:  Skin is warm, dry and intact. No rash noted on exposed skin. Psychiatric: Mood and affect are normal. Speech and behavior are normal.  ____________________________________________   LABS (all labs ordered are listed, but only abnormal results are displayed)  Labs Reviewed  WET PREP, GENITAL - Abnormal; Notable for the following components:      Result Value   WBC, Wet Prep HPF POC RARE (*)    All other components within normal limits   URINALYSIS, COMPLETE (UACMP) WITH MICROSCOPIC - Abnormal; Notable for the following components:   Color, Urine YELLOW (*)    APPearance CLEAR (*)    Ketones, ur 80 (*)    Leukocytes,Ua TRACE (*)    Bacteria, UA RARE (*)    All other components within normal limits  CHLAMYDIA/NGC RT PCR ([facility] ONLY)            URINE CULTURE   ____________________________________________  RADIOLOGY  15w 1 day single IUP with FHR 160. ____________________________________________  Procedures  ____________________________________________  25 year old female presenting to the ER for symptoms as described in HPI. Plan will be to do a pelvic exam and send specimens to the lab for vaginitis and gc/chlamydia. Will also get [place] to evaluate for oligohydraminos and basic fetal scan.  [place] is overall reassuring. Vaginal swabs negative for infection or vaginitis. Will treat with Keflex for potential residual UTI.  She was originally slightly tachycardic but on repeat vitals her heart rate is now normal.  Temp had  increased to 99 but is now 98.6.  Patient is safe for discharge at this point.  She has a follow-up appointment with [facility] clinic in early August.  She was encouraged to keep that appointment or call and request an earlier appointment for concerns.  She was encouraged to return to the emergency department for symptoms of change or worsen if she is unable to schedule an appointment.  INITIAL IMPRESSION / ASSESSMENT AND PLAN / ED COURSE  Pertinent labs & imaging results that were available during my care of the patient were reviewed by me and considered in my medical decision making (see chart for details).  ____________________________________________   FINAL CLINICAL IMPRESSION(S) / ED DIAGNOSES  Final diagnoses:  Acute cystitis during pregnancy in second trimester    Note:  This document was prepared using Dragon voice recognition software and may include unintentional dictation errors.    Chinita Pester, FNP 08/30/20 2109    Gilles Chiquito, MD 08/30/20 2136

## 2020-08-30 NOTE — Telephone Encounter (Signed)
Patient is concerned with a possible yeast infection.

## 2020-08-30 NOTE — ED Triage Notes (Addendum)
Pt comes with c/o possible UTI and leaking fluid. Pt states she noticed her underwear were really wet and called OBGYN and was advised to come here. Pt is about [redacted] weeks along.  Pt also states she did take antibiotics prescribed.  Pt states foul odor and discharge.

## 2020-08-31 NOTE — Addendum Note (Signed)
Addended by: Heywood Bene on: 08/31/2020 09:20 AM   Modules accepted: Orders

## 2020-09-01 LAB — URINE CULTURE: Culture: 20000 — AB

## 2020-09-03 ENCOUNTER — Ambulatory Visit: Payer: Medicaid Other | Admitting: Family Medicine

## 2020-09-03 ENCOUNTER — Other Ambulatory Visit: Payer: Self-pay

## 2020-09-03 VITALS — BP 121/73 | HR 94 | Temp 98.4°F | Wt 249.0 lb

## 2020-09-03 DIAGNOSIS — G43909 Migraine, unspecified, not intractable, without status migrainosus: Secondary | ICD-10-CM

## 2020-09-03 DIAGNOSIS — O99210 Obesity complicating pregnancy, unspecified trimester: Secondary | ICD-10-CM

## 2020-09-03 DIAGNOSIS — O99212 Obesity complicating pregnancy, second trimester: Secondary | ICD-10-CM

## 2020-09-03 DIAGNOSIS — O0990 Supervision of high risk pregnancy, unspecified, unspecified trimester: Secondary | ICD-10-CM

## 2020-09-03 DIAGNOSIS — O0992 Supervision of high risk pregnancy, unspecified, second trimester: Secondary | ICD-10-CM

## 2020-09-03 DIAGNOSIS — O24410 Gestational diabetes mellitus in pregnancy, diet controlled: Secondary | ICD-10-CM

## 2020-09-03 LAB — URINALYSIS
Bilirubin, UA: NEGATIVE
Glucose, UA: NEGATIVE
Nitrite, UA: NEGATIVE
Protein,UA: NEGATIVE
RBC, UA: NEGATIVE
Specific Gravity, UA: 1.01 (ref 1.005–1.030)
Urobilinogen, Ur: 0.2 mg/dL (ref 0.2–1.0)
pH, UA: 6 (ref 5.0–7.5)

## 2020-09-03 NOTE — Progress Notes (Signed)
Urine dip reviewed during clinic visit.   Appointment reminder card given to patient for 4 week f/u. (Aug 19 at 3:40). Patient to call us if she see [facility] for transfer of care beforehand.    Floy Sabina, RN

## 2020-09-05 LAB — AFP TETRA
DIA Mom Value: 0.87
DIA Value (EIA): 107.65 pg/mL
DSR (By Age)    1 IN: 1022
DSR (Second Trimester) 1 IN: 10000
Gestational Age: 15.2 WEEKS
MSAFP Mom: 1.09
MSAFP: 25.3 ng/mL
MSHCG Mom: 1.3
MSHCG: 49940 m[IU]/mL
Maternal Age At EDD: 25.2 yr
Osb Risk: 9231
T18 (By Age): 1:3981 {titer}
Test Results:: NEGATIVE
Weight: 249 [lb_av]
uE3 Mom: 1.39
uE3 Value: 1.04 ng/mL

## 2020-09-05 NOTE — Progress Notes (Signed)
   PRENATAL VISIT NOTE  Subjective:  Kristen Little is a 25 y.o. [phone] at [redacted]w[redacted]d being seen today for ongoing prenatal care.  She is currently monitored for the following issues for this high-risk pregnancy and has Abnormal uterine bleeding; Migraines; Pregnancy, supervision for, high-risk, unspecified trimester; Abnormal glucose affecting pregnancy; DM (diabetes mellitus), gestational 08/10/20 @ 11 6/7 wks; UTI (urinary tract infection) during pregnancy 08/06/20 50-100,000 Enterococcus faecalis; and Obesity in pregnancy BMI=43.9 on their problem list.  Patient reports no complaints.  Contractions: Not present. Vag. Bleeding: None.  Movement: Absent. Denies leaking of fluid/ROM.   The following portions of the patient's history were reviewed and updated as appropriate: allergies, current medications, past family history, past medical history, past social history, past surgical history and problem list. Problem list updated.  Objective:   Vitals:   09/03/20 1722  BP: 121/73  Pulse: 94  Temp: 98.4 F (36.9 C)  Weight: 249 lb (112.9 kg)    Fetal Status: Fetal Heart Rate (bpm): 152 Fundal Height: 17 cm Movement: Absent     General:  Alert, oriented and cooperative. Patient is in no acute distress.  Skin: Skin is warm and dry. No rash noted.   Cardiovascular: Normal heart rate noted  Respiratory: Normal respiratory effort, no problems with respiration noted  Abdomen: Soft, gravid, appropriate for gestational age.  Pain/Pressure: Absent     Pelvic: Cervical exam deferred        Extremities: Normal range of motion.  Edema: None  Mental Status: Normal mood and affect. Normal behavior. Normal judgment and thought content.   Assessment and Plan:  Pregnancy: G4P3003 at [redacted]w[redacted]d  1. Pregnancy, supervision for, high-risk, unspecified trimester Pt transferring to [facility] - Urine Culture - Urinalysis (Urine Dip) - AFP TETRA  2. Diet controlled gestational diabetes mellitus (GDM), antepartum  PT keeps  BS log on app.   -Blood sugar log values below, diet  control. Encouraged continued daily exercise and diet modifications.  0 of 3 FBS values are abnormal, range 89-94 0 of 8 2hr pp values are abnormal, range 93-130 Exercise: walking and prenatal yoga Recent food: Breakfast = keto cereal and coffee with sugar free creamer            Lunch = low carb tortilla, cheese and pepperoni             Dinner ="varies"            Snack = string cheese and vienna sausages  Water: 6 bottle/ day  -Urine results +1 leuks and trace ketones.  -Ultrasound scheduled for 09/15/20 -Will need IOL at 39 wks, plan to complete paperwork at 38 wks.   3. Migraine without status migrainosus, not intractable, unspecified migraine type  Denies any HA   4. Obesity in pregnancy, antepartum Praised for weight loss, encourage to continue with diet and exercise.     Preterm labor symptoms and general obstetric precautions including but not limited to vaginal bleeding, contractions, leaking of fluid and fetal movement were reviewed in detail with the patient. Please refer to After Visit Summary for other counseling recommendations.  No follow-ups on file.  Future Appointments  Date Time Provider Department Center  10/01/2020  4:00 PM [facility] PROVIDER [facility] None    Wendi Snipes, FNP

## 2020-09-06 LAB — URINE CULTURE: Organism ID, Bacteria: NO GROWTH

## 2020-09-08 ENCOUNTER — Telehealth: Payer: Self-pay

## 2020-09-08 NOTE — Telephone Encounter (Signed)
TC to [facility] to confirm patient transfer of care appointment on 09/15/2020. Patient had talked with someone at [facility] and had questions about needing U/S due to her diabetes and thought she had missed an U/S at [facility] on 08/31/2020. Per scanned documents patient had a NIPT blood test scheduled for 08/31/2020 but, per patient, she cancelled that appointment. Discussed with KC that she will be due her anatomy U/S in 2-4 weeks, but that no other U/S referral had been made. Discussed with patient that she did not miss an U/S on 08/31/2020 and that U/S would most likely be discussed with her at [facility] on 09/15/2020. Patient did have a telemed appointment with [facility] re: diabetic teaching. Patient states she has been checking her sugars first thing in the morning and after meals. She describes how she collects her BS and states she read the book that came with her machine. Patient feels comfortable with the process and states how to check her sugars correctly. Patient denies any further questions at this time.Burt Knack, RN

## 2020-09-13 ENCOUNTER — Telehealth: Payer: Self-pay

## 2020-09-13 ENCOUNTER — Encounter: Payer: Self-pay | Admitting: Physician Assistant

## 2020-09-13 ENCOUNTER — Emergency Department
Admission: EM | Admit: 2020-09-13 | Discharge: 2020-09-13 | Disposition: A | Discharge status: Home / Self Care | Payer: Medicaid Other | Attending: Emergency Medicine | Admitting: Emergency Medicine

## 2020-09-13 DIAGNOSIS — O26891 Other specified pregnancy related conditions, first trimester: Secondary | ICD-10-CM | POA: Diagnosis not present

## 2020-09-13 DIAGNOSIS — R1110 Vomiting, unspecified: Secondary | ICD-10-CM | POA: Diagnosis not present

## 2020-09-13 DIAGNOSIS — Z20822 Contact with and (suspected) exposure to covid-19: Secondary | ICD-10-CM | POA: Diagnosis not present

## 2020-09-13 DIAGNOSIS — Z3A14 14 weeks gestation of pregnancy: Secondary | ICD-10-CM | POA: Diagnosis not present

## 2020-09-13 DIAGNOSIS — Z7982 Long term (current) use of aspirin: Secondary | ICD-10-CM | POA: Insufficient documentation

## 2020-09-13 DIAGNOSIS — R42 Dizziness and giddiness: Secondary | ICD-10-CM | POA: Insufficient documentation

## 2020-09-13 LAB — BASIC METABOLIC PANEL
Anion gap: 9 (ref 5–15)
BUN: 13 mg/dL (ref 6–20)
CO2: 24 mmol/L (ref 22–32)
Calcium: 9.9 mg/dL (ref 8.9–10.3)
Chloride: 104 mmol/L (ref 98–111)
Creatinine, Ser: 0.63 mg/dL (ref 0.44–1.00)
GFR, Estimated: >60 mL/min (ref >60)
Glucose, Bld: 84 mg/dL (ref 70–99)
Potassium: 3.8 mmol/L (ref 3.5–5.1)
Sodium: 137 mmol/L (ref 135–145)

## 2020-09-13 LAB — CBC
HCT: 40.2 % (ref 36.0–46.0)
Hemoglobin: 13.8 g/dL (ref 12.0–15.0)
MCH: 30.2 pg (ref 26.0–34.0)
MCHC: 34.3 g/dL (ref 30.0–36.0)
MCV: 88 fL (ref 80.0–100.0)
Platelets: 208 10*3/uL (ref 150–400)
RBC: 4.57 MIL/uL (ref 3.87–5.11)
RDW: 13.3 % (ref 11.5–15.5)
WBC: 9 10*3/uL (ref 4.0–10.5)
nRBC: 0 % (ref 0.0–0.2)

## 2020-09-13 LAB — URINALYSIS, COMPLETE (UACMP) WITH MICROSCOPIC
Bilirubin Urine: NEGATIVE
Glucose, UA: NEGATIVE mg/dL
Hgb urine dipstick: NEGATIVE
Ketones, ur: NEGATIVE mg/dL
Leukocytes,Ua: NEGATIVE
Nitrite: NEGATIVE
Protein, ur: NEGATIVE mg/dL
Specific Gravity, Urine: 1.005 (ref 1.005–1.030)
pH: 6 (ref 5.0–8.0)

## 2020-09-13 LAB — RESP PANEL BY RT-PCR (FLU A&B, COVID) ARPGX2
Influenza A by PCR: NEGATIVE
Influenza B by PCR: NEGATIVE
SARS Coronavirus 2 by RT PCR: NEGATIVE

## 2020-09-13 LAB — PREGNANCY, URINE: Preg Test, Ur: POSITIVE — AB

## 2020-09-13 LAB — LIPASE, BLOOD: Lipase: 40 U/L (ref 11–51)

## 2020-09-13 MED ORDER — ONDANSETRON 4 MG PO TBDP
4.0000 mg | ORAL_TABLET | Freq: Three times a day (TID) | ORAL | 0 refills | Status: DC | PRN
Start: 2020-09-13 — End: 2020-10-06

## 2020-09-13 NOTE — ED Triage Notes (Addendum)
Patient presents to ER from home. Patient reports she was diagnosed with gestational diabetes. Patient reports she is 16 weeks. Patient reports she has not seen on OB but called office d/t headache/dizziness and was told to come to ER for eval. Patient A&Ox3.

## 2020-09-13 NOTE — Telephone Encounter (Signed)
Call transferred from [place] at reception desk to [facility]. Per client, has not felt well for past few days with a headache, feelings of dizziness / lightheadedness and nausea. States symptoms have worsened in past 2 days. Client has been transferred to [facility] due to GDM in early pregnancy, but appt not yet scheduled at [facility]. Consult with Hazle Coca CNM regarding above and client referred now to ED for evaluation. Client agreeable with plan and states will notify her husband. Jossie Ng, RN

## 2020-09-13 NOTE — ED Provider Notes (Signed)
[facility] Emergency Department Provider Note  ____________________________________________   Event Date/Time   First MD Initiated Contact with Patient 09/13/20 1731     (approximate)  I have reviewed the triage vital signs and the nursing notes.  HISTORY  Chief Complaint Headache and Dizziness  HPI Kristen Little is a 25 y.o. female G4P3 presents at [redacted] weeks gestation, who notes a single episode of non-bloody, non-bilious emesis while trying to eat. She denies any FCS, chest pain, cough, or congestion. She was recently diagnosed with gestational diabetes on a 11-week GA GTT. She is currently just monitoring blood sugars QID.    Past Medical History:  Diagnosis Date   Irritable bowel syndrome (IBS)    Medical history non-contributory    Ovarian cyst    Vaginal Pap smear, abnormal     Patient Active Problem List   Diagnosis Date Noted   UTI (urinary tract infection) during pregnancy 08/06/20 50-100,000 Enterococcus faecalis 08/13/2020   Obesity in pregnancy BMI=43.9 08/13/2020   DM (diabetes mellitus), gestational 08/10/20 @ 11 6/7 wks 08/11/2020   Abnormal glucose affecting pregnancy 08/09/2020   Pregnancy, supervision for, high-risk, unspecified trimester 08/06/2020   Migraines 07/04/2019   Abnormal uterine bleeding 12/30/2018    Past Surgical History:  Procedure Laterality Date   ADENOIDECTOMY W/ MYRINGOTOMY Bilateral    done as a child   CHOLECYSTECTOMY  05/27/2019   TONSILLECTOMY      Prior to Admission medications   Medication Sig Start Date End Date Taking? Authorizing Provider  ondansetron (ZOFRAN ODT) 4 MG disintegrating tablet Take 1 tablet (4 mg total) by mouth every 8 (eight) hours as needed. 09/13/20  Yes Shubh Chiara, Dannielle Karvonen, PA-C  ACCU-CHEK GUIDE test strip USE TO CHECK BLOOD SUGAR AS DIRECTED WITH INSULIN THREE TIMES DAILY AND FOR SYMPTOMS OF HIGH OR LOW BLOOD SUGAR. 08/31/20   [provider]  Accu-Chek Softclix Lancets  lancets 3 (three) times daily. 08/31/20   [provider]  aspirin EC 81 MG tablet Take 81 mg by mouth daily. Swallow whole.    [provider]  BD PEN NEEDLE NANO 2ND GEN 32G X 4 MM MISC USE AS DIRECTED WITH INSULIN UP TO 4 TIMES DAILY AS NEEDED 08/31/20   [provider]  Blood Glucose Monitoring Suppl (ACCU-CHEK GUIDE ME) w/Device KIT See admin instructions. 08/31/20   [provider]  Prenatal Vit-Fe Fumarate-FA (PRENATAL VITAMINS) 28-0.8 MG TABS Take 1 tablet by mouth daily. 06/16/20   Caren Macadam, MD    Allergies Azithromycin and Penicillins  Family History  Problem Relation Age of Onset   Heart disease Maternal Grandmother    Cancer Maternal Grandmother    Diabetes Maternal Grandmother    Cancer Maternal Grandfather    Hepatitis C Paternal Grandmother     Social History Social History   Tobacco Use   Smoking status: Never   Smokeless tobacco: Never   Tobacco comments:    Denies secondhand smoke.  Vaping Use   Vaping Use: Never used  Substance Use Topics   Alcohol use: No   Drug use: No    Review of Systems  Constitutional: No fever/chills Eyes: No visual changes. ENT: No sore throat. Cardiovascular: Denies chest pain. Respiratory: Denies shortness of breath. Gastrointestinal: No abdominal pain.  No nausea, no vomiting.  No diarrhea.  No constipation. Genitourinary: Negative for dysuria. Musculoskeletal: Negative for back pain. Skin: Negative for rash. Neurological: Negative for headaches, focal weakness or numbness. ____________________________________________  PHYSICAL EXAM:  VITAL SIGNS: ED Triage Vitals  Enc Vitals Group     BP 09/13/20 1603 128/79     Pulse Rate 09/13/20 1603 97     Resp 09/13/20 1603 20     Temp 09/13/20 1603 98.4 F (36.9 C)     Temp Source 09/13/20 1603 Oral     SpO2 09/13/20 1603 99 %     Weight 09/13/20 1602 249 lb (112.9 kg)     Height 09/13/20 1602 _0  (1.626 m)     Head  Circumference --      Peak Flow --      Pain Score 09/13/20 1614 0     Pain Loc --      Pain Edu? --      Excl. in [place]? --     Constitutional: Alert and oriented. Well appearing and in no acute distress. Eyes: Conjunctivae are normal. PERRL. EOMI. Head: Atraumatic. Nose: No congestion/rhinnorhea. Mouth/Throat: Mucous membranes are moist.  Oropharynx non-erythematous. Neck: No stridor.   Cardiovascular: Normal rate, regular rhythm. Grossly normal heart sounds.  Good peripheral circulation. Respiratory: Normal respiratory effort.  No retractions. Lungs CTAB. Gastrointestinal: Soft and nontender. No distention. No abdominal bruits. No CVA tenderness. Musculoskeletal: No lower extremity tenderness nor edema.  No joint effusions. Neurologic:  Normal speech and language. No gross focal neurologic deficits are appreciated. No gait instability. Skin:  Skin is warm, dry and intact. No rash noted. Psychiatric: Mood and affect are normal. Speech and behavior are normal.  ____________________________________________   LABS (all labs ordered are listed, but only abnormal results are displayed)  Labs Reviewed  URINALYSIS, COMPLETE (UACMP) WITH MICROSCOPIC - Abnormal; Notable for the following components:      Result Value   Color, Urine STRAW (*)    APPearance CLEAR (*)    Bacteria, UA RARE (*)    All other components within normal limits  PREGNANCY, URINE - Abnormal; Notable for the following components:   Preg Test, Ur POSITIVE (*)    All other components within normal limits  RESP PANEL BY RT-PCR (FLU A&B, COVID) ARPGX2  BASIC METABOLIC PANEL  CBC  LIPASE, BLOOD   ____________________________________________  EKG  ____________________________________________  RADIOLOGY I, Melvenia Needles, personally viewed and evaluated these images (plain radiographs) as part of my medical decision making, as well as reviewing the written report by the radiologist.  ED MD  interpretation:    Official radiology report(s): No results found.  ____________________________________________   PROCEDURES  Procedure(s) performed (including Critical Care):  Procedures  Fetal heart tones noted to be in the 140s. ____________________________________________   INITIAL IMPRESSION / ASSESSMENT AND PLAN / ED COURSE  As part of my medical decision making, I reviewed the following data within the [place] reviewed WNL and Notes from prior ED visits    DDX: hypoglycemia, hyperglycemia, UTI, dehydration   Patient insect trimester pregnancy with a history of gestational diabetes being managed at this point with diet control.  She presented to the ED for concern over some headache and dizziness.  Patient's exam is overall benign including labs that are reassuring.  No signs of hypoglycemia, dehydration, urinary tract infection.  No electrolyte abnormalities noted.  Patient stable at this time, she tolerated p.o. intake without subsequent emesis.  She is discharged at this time follow-up with [facility] provider as discussed.  Return precautions have been reviewed patient continue to monitor blood sugars as directed. ____________________________________________   FINAL CLINICAL IMPRESSION(S) / ED  DIAGNOSES  Final diagnoses:  [place]     ED Discharge Orders          Ordered    ondansetron (ZOFRAN ODT) 4 MG disintegrating tablet  Every 8 hours PRN        09/13/20 1959             Note:  This document was prepared using Dragon voice recognition software and may include unintentional dictation errors.    Melvenia Needles, PA-C 09/15/20 1700    Harvest Dark, MD 09/19/20 [phone]

## 2020-09-13 NOTE — Telephone Encounter (Signed)
Client is a transfer of care to [facility] due to GDM and records previously faxed with referral. Call received this am from [facility] as nurse thinks some of office visit records are missing. Client ha 2 office visits as new OB was 08/06/2020. Office visit records faxed with confirmation received and call made to notify [facility] of above. Jossie Ng, RN

## 2020-09-13 NOTE — Discharge Instructions (Addendum)
Your exam, labs, and fetal heart tones normal at this time.  No indication of serious infectious process.  Be sure to eat regularly scheduled meals and snacks according to instructions for management of your blood sugar.  You should continue to monitor symptoms, and follow-up with a primary OB provider for ongoing management.  Return to the ED if needed.

## 2020-09-13 NOTE — ED Notes (Signed)
FHTs 146

## 2020-09-15 ENCOUNTER — Telehealth: Payer: Self-pay | Admitting: Family Medicine

## 2020-09-15 NOTE — Telephone Encounter (Signed)
Patients wants a referral to [facility] instead of [facility]. [facility] hours work better for her and also asked if we could fax her records over to [facility].

## 2020-09-15 NOTE — Telephone Encounter (Signed)
Phone call returned to patient. Patient states she has decided that she wants to transfer her prenatal care to [facility] instead of [facility] because of needing afternoon appts that [facility] can provide. Patient initially was to transfer prenatal care to [facility] for GDM management care during pregnancy but states she needs to have appts at a local provider due to the distance to [facility]. Referral for transfer of care request faxed to [facility]. Advised patient that [facility] would be calling to schedule transfer of care appt. Patient also requesting an ultrasound referral. Informed patient that [facility] would schedule needed ultrasounds once transfer of care appt scheduled and completed. Patient has a scheduled follow up maternity appt here for 10/01/2020 and is aware to keep that appt if she has not been seen yet at [facility]. Patient verbalized understanding of above. Tawny Hopping, RN

## 2020-09-16 ENCOUNTER — Emergency Department
Admission: EM | Admit: 2020-09-16 | Discharge: 2020-09-17 | Disposition: A | Discharge status: Home / Self Care | Payer: Medicaid Other | Attending: Emergency Medicine | Admitting: Emergency Medicine

## 2020-09-16 ENCOUNTER — Emergency Department: Payer: Medicaid Other

## 2020-09-16 ENCOUNTER — Other Ambulatory Visit: Payer: Self-pay

## 2020-09-16 DIAGNOSIS — R102 Pelvic and perineal pain: Secondary | ICD-10-CM

## 2020-09-16 DIAGNOSIS — O209 Hemorrhage in early pregnancy, unspecified: Secondary | ICD-10-CM | POA: Diagnosis present

## 2020-09-16 DIAGNOSIS — O200 Threatened abortion: Secondary | ICD-10-CM | POA: Diagnosis not present

## 2020-09-16 DIAGNOSIS — Z3A17 17 weeks gestation of pregnancy: Secondary | ICD-10-CM | POA: Insufficient documentation

## 2020-09-16 DIAGNOSIS — Z7982 Long term (current) use of aspirin: Secondary | ICD-10-CM | POA: Diagnosis not present

## 2020-09-16 DIAGNOSIS — O4690 Antepartum hemorrhage, unspecified, unspecified trimester: Secondary | ICD-10-CM

## 2020-09-16 LAB — WET PREP, GENITAL
Clue Cells Wet Prep HPF POC: NONE SEEN
Sperm: NONE SEEN
Trich, Wet Prep: NONE SEEN
Yeast Wet Prep HPF POC: NONE SEEN

## 2020-09-16 LAB — CBC
HCT: 39.4 % (ref 36.0–46.0)
Hemoglobin: 13.4 g/dL (ref 12.0–15.0)
MCH: 29.6 pg (ref 26.0–34.0)
MCHC: 34 g/dL (ref 30.0–36.0)
MCV: 87 fL (ref 80.0–100.0)
Platelets: 202 10*3/uL (ref 150–400)
RBC: 4.53 MIL/uL (ref 3.87–5.11)
RDW: 13.5 % (ref 11.5–15.5)
WBC: 8.8 10*3/uL (ref 4.0–10.5)
nRBC: 0 % (ref 0.0–0.2)

## 2020-09-16 LAB — BASIC METABOLIC PANEL
Anion gap: 9 (ref 5–15)
BUN: 11 mg/dL (ref 6–20)
CO2: 21 mmol/L — ABNORMAL LOW (ref 22–32)
Calcium: 9.3 mg/dL (ref 8.9–10.3)
Chloride: 102 mmol/L (ref 98–111)
Creatinine, Ser: 0.5 mg/dL (ref 0.44–1.00)
GFR, Estimated: >60 mL/min (ref >60)
Glucose, Bld: 98 mg/dL (ref 70–99)
Potassium: 3.6 mmol/L (ref 3.5–5.1)
Sodium: 132 mmol/L — ABNORMAL LOW (ref 135–145)

## 2020-09-16 LAB — ABO/RH: ABO/RH(D): B POS

## 2020-09-16 LAB — HCG, QUANTITATIVE, PREGNANCY: hCG, Beta Chain, Quant, S: 41149 m[IU]/mL — ABNORMAL HIGH (ref <5)

## 2020-09-16 NOTE — Discharge Instructions (Addendum)
Please call your doctor at [facility] and arrange follow-up.  Let them know you were seen in the emergency room with vaginal bleeding and pregnancy.  Should see you within the next couple days.  Please return here for increasing pain cramping fever or heavier bleeding or lightheadedness.  Has I explained to you anytime there is bleeding in pregnancy is a threatened miscarriage although fairly often everything turns out to be perfectly fine.  It is a good idea not to put anything in the vagina for at least the next few days.

## 2020-09-16 NOTE — ED Notes (Signed)
Unable to find Fetal heart tones at this time

## 2020-09-16 NOTE — ED Provider Notes (Signed)
[facility] Emergency Department Provider Note   ____________________________________________   Event Date/Time   First MD Initiated Contact with Patient 09/16/20 2209     (approximate)  I have reviewed the triage vital signs and the nursing notes.   HISTORY  Chief Complaint Vaginal Bleeding    HPI Kristen Little is a 25 y.o. female who complains of some vaginal minimal bleeding more like spotting and some cramping.  The cramping has since ceased.  There is no spotting currently.  This is patient's fourth child.  Patient is G4, P3.  Health department is transferred her to [facility] because of high risk pregnancy.         Past Medical History:  Diagnosis Date   Irritable bowel syndrome (IBS)    Medical history non-contributory    Ovarian cyst    Vaginal Pap smear, abnormal     Patient Active Problem List   Diagnosis Date Noted   UTI (urinary tract infection) during pregnancy 08/06/20 50-100,000 Enterococcus faecalis 08/13/2020   Obesity in pregnancy BMI=43.9 08/13/2020   DM (diabetes mellitus), gestational 08/10/20 @ 11 6/7 wks 08/11/2020   Abnormal glucose affecting pregnancy 08/09/2020   Pregnancy, supervision for, high-risk, unspecified trimester 08/06/2020   Migraines 07/04/2019   Abnormal uterine bleeding 12/30/2018    Past Surgical History:  Procedure Laterality Date   ADENOIDECTOMY W/ MYRINGOTOMY Bilateral    done as a child   CHOLECYSTECTOMY  05/27/2019   TONSILLECTOMY      Prior to Admission medications   Medication Sig Start Date End Date Taking? Authorizing Provider  ACCU-CHEK GUIDE test strip USE TO CHECK BLOOD SUGAR AS DIRECTED WITH INSULIN THREE TIMES DAILY AND FOR SYMPTOMS OF HIGH OR LOW BLOOD SUGAR. 08/31/20   [provider]  Accu-Chek Softclix Lancets lancets 3 (three) times daily. 08/31/20   [provider]  aspirin EC 81 MG tablet Take 81 mg by mouth daily. Swallow whole.    [provider]  BD  PEN NEEDLE NANO 2ND GEN 32G X 4 MM MISC USE AS DIRECTED WITH INSULIN UP TO 4 TIMES DAILY AS NEEDED 08/31/20   [provider]  Blood Glucose Monitoring Suppl (ACCU-CHEK GUIDE ME) w/Device KIT See admin instructions. 08/31/20   [provider]  ondansetron (ZOFRAN ODT) 4 MG disintegrating tablet Take 1 tablet (4 mg total) by mouth every 8 (eight) hours as needed. 09/13/20   Menshew, Dannielle Karvonen, PA-C  Prenatal Vit-Fe Fumarate-FA (PRENATAL VITAMINS) 28-0.8 MG TABS Take 1 tablet by mouth daily. 06/16/20   Caren Macadam, MD    Allergies Azithromycin and Penicillins  Family History  Problem Relation Age of Onset   Heart disease Maternal Grandmother    Cancer Maternal Grandmother    Diabetes Maternal Grandmother    Cancer Maternal Grandfather    Hepatitis C Paternal Grandmother     Social History Social History   Tobacco Use   Smoking status: Never   Smokeless tobacco: Never   Tobacco comments:    Denies secondhand smoke.  Vaping Use   Vaping Use: Never used  Substance Use Topics   Alcohol use: No   Drug use: No    Review of Systems Currently Constitutional: No fever/chills Eyes: No visual changes. ENT: No sore throat. Cardiovascular: Denies chest pain. Respiratory: Denies shortness of breath. Gastrointestinal: No abdominal pain.  No nausea, no vomiting.  No diarrhea.  No constipation. Genitourinary: Negative for dysuria. Musculoskeletal: Negative for back pain. Skin: Negative for rash. Neurological: Negative  for headaches, focal weakness   ____________________________________________   PHYSICAL EXAM:  VITAL SIGNS: ED Triage Vitals [09/16/20 1638]  Enc Vitals Group     BP 114/84     Pulse Rate 89     Resp 20     Temp 99 F (37.2 C)     Temp Source Oral     SpO2 100 %     Weight 255 lb (115.7 kg)     Height 5\' 4"  (1.626 m)     Head Circumference      Peak Flow      Pain Score 4     Pain Loc      Pain Edu?      Excl. in [place]?      Constitutional: Alert and oriented. Well appearing and in no acute distress. Eyes: Conjunctivae are normal.  Head: Atraumatic. Nose: No congestion/rhinnorhea. Mouth/Throat: Mucous membranes are moist.  Oropharynx non-erythematous. Neck: No stridor.  Cardiovascular: Normal rate, regular rhythm. Grossly normal heart sounds.  Good peripheral circulation. Respiratory: Normal respiratory effort.  No retractions. Lungs CTAB. Gastrointestinal: Soft and nontender. No distention. No abdominal bruits. No CVA tenderness. Genitourinary: Normal perineum and vagina there is some small amount of greenish discharge.  The cervix is long and closed there is no cervical motion tenderness reddened adnexal tenderness or masses. Musculoskeletal: No lower extremity tenderness nor edema.  No joint effusions. Neurologic:  Normal speech and language. No gross focal neurologic deficits are appreciated. No gait instability. Skin:  Skin is warm, dry and intact. No rash noted. Psychiatric: Mood and affect are normal. Speech and behavior are normal.  ____________________________________________   LABS (all labs ordered are listed, but only abnormal results are displayed)  Labs Reviewed  WET PREP, GENITAL - Abnormal; Notable for the following components:      Result Value   WBC, Wet Prep HPF POC MANY (*)    All other components within normal limits  BASIC METABOLIC PANEL - Abnormal; Notable for the following components:   Sodium 132 (*)    CO2 21 (*)    All other components within normal limits  HCG, QUANTITATIVE, PREGNANCY - Abnormal; Notable for the following components:   hCG, Beta Chain, Quant, S 41,149 (*)    All other components within normal limits  CBC  URINALYSIS, COMPLETE (UACMP) WITH MICROSCOPIC  ABO/RH   ____________________________________________  EKG   ____________________________________________  RADIOLOGY , personally viewed and evaluated these images (plain  radiographs) as part of my medical decision making, as well as reviewing the written report by the radiologist.  ED MD interpretation: Ultrasound read by radiology reviewed by me shows only normal pregnancy  Official radiology report(s): [facility] Limited  Result Date: 09/16/2020 CLINICAL DATA:  Pelvic pain EXAM: LIMITED OBSTETRIC ULTRASOUND COMPARISON:  Ultrasound 08/30/2020 FINDINGS: Number of Fetuses: 1 Heart Rate:  150 bpm Movement: Yes Presentation: Variable Placental Location: Posterior Previa: No Amniotic Fluid (Subjective):  Within normal limits. AFI: 4.8 cm BPD: 3.7 cm 17 w  2 d MATERNAL FINDINGS: Cervix:  Appears closed. Uterus/Adnexae: No abnormality visualized. IMPRESSION: Single viable intrauterine pregnancy, with appropriate interval growth since the prior exam. Subjectively normal amniotic fluid volume. EDC based on prior examination is 02/23/2021. This exam is performed on an emergent basis and does not comprehensively evaluate fetal size, dating, or anatomy; follow-up complete OB 04/23/2021 should be considered if further fetal assessment is warranted. Electronically Signed   By: [place]   On: 09/16/2020 18:54  ____________________________________________   PROCEDURES  Procedure(s) performed (including Critical Care):  Procedures   ____________________________________________   INITIAL IMPRESSION / ASSESSMENT AND PLAN / ED COURSE   Gust patient with Dr. Kenton Kingfisher from [place].  They will follow-up.  He is not concerned about the white cells at this point.  The rest of the wet prep including trichomonas clue cells etc. is negative.  Patient is having no further symptoms currently.  She will follow-up with [facility] returning here for increasing pain fever vomiting lightheadedness or further or heavier bleeding.  This appears to be a threatened miscarriage at this time.          ____________________________________________   FINAL CLINICAL IMPRESSION(S) / ED  DIAGNOSES  Final diagnoses:  Vaginal bleeding in pregnancy  Threatened miscarriage     ED Discharge Orders     None        Note:  This document was prepared using Dragon voice recognition software and may include unintentional dictation errors.    Nena Polio, MD 09/16/20 [phone]

## 2020-09-16 NOTE — ED Triage Notes (Signed)
Pt to ED for vaginal bleeding that started today. Reports it is minimal. Also reports lower abd cramping [redacted] weeks pregnant Dx with gestational diabetes

## 2020-09-17 ENCOUNTER — Telehealth: Payer: Self-pay | Admitting: Family Medicine

## 2020-09-17 NOTE — Telephone Encounter (Signed)
Patient states that she was at the ER last night and was told to come here for a follow up.

## 2020-09-17 NOTE — Telephone Encounter (Signed)
Returned call to patient who states she went to the ED and was told to schedule a follow-up appt with her OB provider. Patient requesting follow-up at [facility] and states problem has resolved, but still wants appt. She states she has an appt scheduled at [facility] but can't get there in the morning. Needs afternoon appt as she is sharing a car with her husband. Patient informed that we have no 09/20/2020 afternoon appts available, and she states she will come on 09/21/2020. Patient scheduled for 09/21/2020 and told to arrive at 3:30. Patient states her husband gets home at 3:30 and she will be here as soon as she can.Marland KitchenMarland KitchenBurt Knack, RN

## 2020-09-20 ENCOUNTER — Ambulatory Visit: Payer: Medicaid Other | Admitting: Obstetrics and Gynecology

## 2020-09-21 ENCOUNTER — Other Ambulatory Visit: Payer: Self-pay

## 2020-09-21 ENCOUNTER — Ambulatory Visit: Payer: Medicaid Other | Admitting: Family Medicine

## 2020-09-21 VITALS — BP 106/62 | HR 90 | Temp 97.5°F | Wt 247.4 lb

## 2020-09-21 DIAGNOSIS — O24410 Gestational diabetes mellitus in pregnancy, diet controlled: Secondary | ICD-10-CM

## 2020-09-21 DIAGNOSIS — Z3201 Encounter for pregnancy test, result positive: Secondary | ICD-10-CM

## 2020-09-21 DIAGNOSIS — O0992 Supervision of high risk pregnancy, unspecified, second trimester: Secondary | ICD-10-CM

## 2020-09-21 DIAGNOSIS — O0990 Supervision of high risk pregnancy, unspecified, unspecified trimester: Secondary | ICD-10-CM

## 2020-09-21 MED ORDER — PRENATAL VITAMINS 28-0.8 MG PO TABS: 1.0000 | ORAL_TABLET | Freq: Every day | ORAL | 0 refills | Status: DC

## 2020-09-21 MED ORDER — GLUCOSE BLOOD VI STRP: ORAL_STRIP | 12 refills | Status: DC

## 2020-09-21 NOTE — Progress Notes (Signed)
Patient here for OB problem f/u ED visit on 09/16/20 for vaginal bleeding and cramping.   Patient denies vaginal bleeding and cramping. All resolved now but wanted to see maternity provider as recommend by the ED provider.   Patient is aware of [facility] appointment on 10/08/20 at 3:30.   Patient is requesting an ultrasound as she has not had one by now as well as needing a refill for glucose test strips and PNV.    Floy Sabina, RN

## 2020-09-24 ENCOUNTER — Telehealth: Payer: Self-pay | Admitting: Family Medicine

## 2020-09-24 NOTE — Progress Notes (Signed)
[facility] U/S referral faxed with confirmation.Burt Knack, RN

## 2020-09-24 NOTE — Telephone Encounter (Signed)
Patient called to ask for Lancet prescription (for diabetes) refill.

## 2020-09-24 NOTE — Telephone Encounter (Signed)
Provider, Elveria Rising, FNP alerted that patient needs more lancets and TC to patient to let her know provider will order lancets. Patient asked about her U/S, and counseled that she will receive call with time and date when scheduled, probably early next week. Patient states understanding.Burt Knack, RN

## 2020-09-24 NOTE — Progress Notes (Signed)
Patient in clinic today as follow up from ED visit on 09/16/20.     1. Pregnancy, supervision for, high-risk, unspecified trimester  - Prenatal Vit-Fe Fumarate-FA (PRENATAL VITAMINS) 28-0.8 MG TABS; Take 1 tablet by mouth daily.  Dispense: 100 tablet; Refill: 0  Patient had vaginal bleeding on 09/16/20 and went to ED @ [facility].  Bleeding and cramping that has now resolved.   Patient reports that baby is moving and denies any pain, contractions , pressure, edema.    Patient is transferring care d/t hight risk and had appointment on 10/08/20@ [facility].   Patient will still come to appointment scheduled for 8/19 here at [facility].   Reviewed Quad screen- negative.  Pt needs anatomy U/S that will be ordered today.     3. Diet controlled gestational diabetes mellitus (GDM), antepartum Patient also requesting more strips for glucose checks.  Order sent to her pharmacy on file.   - glucose blood test strip; Use as instructed to take blood sugar 4 times per day.  Dispense: 100 each; Refill: 12

## 2020-09-29 ENCOUNTER — Telehealth: Payer: Self-pay

## 2020-09-29 NOTE — Telephone Encounter (Signed)
Call to client to verify awareness of 10/26/2020 [facility] [place] at  1 pm followed by consult at 2 pm. Jossie Ng, RN

## 2020-10-01 ENCOUNTER — Other Ambulatory Visit: Payer: Self-pay

## 2020-10-01 ENCOUNTER — Ambulatory Visit: Payer: Medicaid Other | Admitting: Family Medicine

## 2020-10-01 VITALS — BP 127/74 | HR 94 | Temp 98.8°F | Wt 247.8 lb

## 2020-10-01 DIAGNOSIS — O0990 Supervision of high risk pregnancy, unspecified, unspecified trimester: Secondary | ICD-10-CM

## 2020-10-01 DIAGNOSIS — O24410 Gestational diabetes mellitus in pregnancy, diet controlled: Secondary | ICD-10-CM

## 2020-10-01 LAB — URINALYSIS
Bilirubin, UA: NEGATIVE
Glucose, UA: NEGATIVE
Ketones, UA: NEGATIVE
Leukocytes,UA: NEGATIVE
Nitrite, UA: NEGATIVE
Protein,UA: NEGATIVE
RBC, UA: NEGATIVE
Specific Gravity, UA: 1.02 (ref 1.005–1.030)
Urobilinogen, Ur: 0.2 mg/dL (ref 0.2–1.0)
pH, UA: 7 (ref 5.0–7.5)

## 2020-10-01 MED ORDER — PRENATAL MULTIVITAMIN CH: 1.0000 | ORAL_TABLET | Freq: Every day | ORAL | 0 refills | Status: DC

## 2020-10-01 MED ORDER — PREPLUS 27-1 MG PO TABS: 1.0000 | ORAL_TABLET | Freq: Every day | ORAL | 3 refills | Status: DC

## 2020-10-01 MED ORDER — ACCU-CHEK SOFTCLIX LANCETS MISC
100.0000 | Freq: Four times a day (QID) | 12 refills | Status: DC
Start: 2020-10-01 — End: 2021-02-19

## 2020-10-01 NOTE — Progress Notes (Signed)
   PRENATAL VISIT NOTE  Subjective:  Kristen Little is a 25 y.o. [phone] at [redacted]w[redacted]d being seen today for ongoing prenatal care.  She is currently monitored for the following issues for this high-risk pregnancy and has Abnormal uterine bleeding; Migraines; Pregnancy, supervision for, high-risk, unspecified trimester; Abnormal glucose affecting pregnancy; DM (diabetes mellitus), gestational 08/10/20 @ 11 6/7 wks; UTI (urinary tract infection) during pregnancy 08/06/20 50-100,000 Enterococcus faecalis; and Obesity in pregnancy BMI=43.9 on their problem list.  Patient reports occasional contractions.  Contractions: Not present. Vag. Bleeding: None.  Movement: Present. Denies leaking of fluid/ROM.   The following portions of the patient's history were reviewed and updated as appropriate: allergies, current medications, past family history, past medical history, past social history, past surgical history and problem list. Problem list updated.  Objective:   Vitals:   10/01/20 1601  BP: 127/74  Pulse: 94  Temp: 98.8 F (37.1 C)  Weight: 247 lb 12.8 oz (112.4 kg)    Fetal Status: Fetal Heart Rate (bpm): 151 Fundal Height: 21 cm Movement: Present     General:  Alert, oriented and cooperative. Patient is in no acute distress.  Skin: Skin is warm and dry. No rash noted.   Cardiovascular: Normal heart rate noted  Respiratory: Normal respiratory effort, no problems with respiration noted  Abdomen: Soft, gravid, appropriate for gestational age.  Pain/Pressure: Present     Pelvic: Cervical exam deferred Dilation: Closed Effacement (%): Thick Station: Ballotable  Extremities: Normal range of motion.  Edema: None  Mental Status: Normal mood and affect. Normal behavior. Normal judgment and thought content.   Assessment and Plan:  Pregnancy: G4P3003 at [redacted]w[redacted]d  1. Diet controlled gestational diabetes mellitus (GDM), antepartum Pt was unable to get lancets d/t how order was written for TID blood sugar checks  verses 4 times per day.  Asked patient to also see of she could but OTC lancets if still having problems with medicaid paying for lancets.    Encourage do continue healthy habits and exercise and to start monitoring BG ASAP.    - Urinalysis (Urine Dip)  2. Pregnancy, supervision for, high-risk, unspecified trimester PNV given in clinic today d/t Patient having problems with medicaid paying for PNV. -recommend patient to f/o with medicaid.    - pt reporting contractions ~ 5 x per day, reports she is resting and stay hydrated, feeling baby movement as normal,  denies bleeding.   Tested for infections today.   -pt transferring to [facility] d/t HR and higher need of care - d/t probable diabetes prior to pregnancy.    -U/S scheduled for 10/26/20  - Prenatal Vit-Fe Fumarate-FA (PREPLUS) 27-1 MG TABS; Take 1 tablet by mouth daily.  Dispense: 100 tablet; Refill: 3 - Prenatal Vit-Fe Fumarate-FA (PRENATAL MULTIVITAMIN) TABS tablet; Take 1 tablet by mouth daily at 12 noon.  Dispense: 100 tablet; Refill: 0 - WET PREP FOR TRICH, YEAST, CLUE - Chlamydia/GC NAA, Confirmation   Preterm labor symptoms and general obstetric precautions including but not limited to vaginal bleeding, contractions, leaking of fluid and fetal movement were reviewed in detail with the patient. Please refer to After Visit Summary for other counseling recommendations.  No follow-ups on file.  Future Appointments  Date Time Provider Department Center  10/08/2020  3:30 PM Nadara Mustard, MD [facility] None  10/26/2020  1:00 PM [facility] US1 [facility]  10/26/2020  2:00 PM [facility] CONSULT RM [facility] None    Wendi Snipes, FNP

## 2020-10-04 ENCOUNTER — Telehealth: Payer: Self-pay

## 2020-10-04 LAB — WET PREP FOR TRICH, YEAST, CLUE
Trichomonas Exam: NEGATIVE
Yeast Exam: NEGATIVE

## 2020-10-04 NOTE — Telephone Encounter (Signed)
Per request of Wendi Snipes FNP-BC via secure chat, call made to client to verify able to obtain lancets from pharmacy with corrected order on 10/01/2020. Per client, was able to retrieve supplies on Saturday without difficulty and has all supplies needed for blood glucose monitoring. Jossie Ng, RN

## 2020-10-06 ENCOUNTER — Other Ambulatory Visit: Payer: Self-pay

## 2020-10-06 ENCOUNTER — Encounter: Payer: Self-pay | Admitting: Advanced Practice Midwife

## 2020-10-06 ENCOUNTER — Observation Stay
Admission: EM | Admit: 2020-10-06 | Discharge: 2020-10-06 | Disposition: A | Discharge status: Home / Self Care | Payer: Medicaid Other | Attending: Advanced Practice Midwife | Admitting: Advanced Practice Midwife

## 2020-10-06 DIAGNOSIS — O26899 Other specified pregnancy related conditions, unspecified trimester: Secondary | ICD-10-CM

## 2020-10-06 DIAGNOSIS — O0992 Supervision of high risk pregnancy, unspecified, second trimester: Secondary | ICD-10-CM | POA: Diagnosis not present

## 2020-10-06 DIAGNOSIS — U071 COVID-19: Secondary | ICD-10-CM

## 2020-10-06 DIAGNOSIS — O368120 Decreased fetal movements, second trimester, not applicable or unspecified: Secondary | ICD-10-CM

## 2020-10-06 DIAGNOSIS — O98512 Other viral diseases complicating pregnancy, second trimester: Principal | ICD-10-CM

## 2020-10-06 DIAGNOSIS — O99810 Abnormal glucose complicating pregnancy: Secondary | ICD-10-CM

## 2020-10-06 DIAGNOSIS — O99891 Other specified diseases and conditions complicating pregnancy: Secondary | ICD-10-CM | POA: Diagnosis not present

## 2020-10-06 DIAGNOSIS — O0990 Supervision of high risk pregnancy, unspecified, unspecified trimester: Secondary | ICD-10-CM

## 2020-10-06 DIAGNOSIS — O24410 Gestational diabetes mellitus in pregnancy, diet controlled: Secondary | ICD-10-CM

## 2020-10-06 DIAGNOSIS — Z7982 Long term (current) use of aspirin: Secondary | ICD-10-CM | POA: Diagnosis not present

## 2020-10-06 DIAGNOSIS — Z3A20 20 weeks gestation of pregnancy: Secondary | ICD-10-CM

## 2020-10-06 DIAGNOSIS — O24429 Gestational diabetes mellitus in childbirth, unspecified control: Secondary | ICD-10-CM | POA: Diagnosis not present

## 2020-10-06 DIAGNOSIS — R197 Diarrhea, unspecified: Secondary | ICD-10-CM | POA: Diagnosis not present

## 2020-10-06 DIAGNOSIS — O99212 Obesity complicating pregnancy, second trimester: Secondary | ICD-10-CM | POA: Diagnosis present

## 2020-10-06 DIAGNOSIS — M549 Dorsalgia, unspecified: Secondary | ICD-10-CM

## 2020-10-06 DIAGNOSIS — R509 Fever, unspecified: Secondary | ICD-10-CM

## 2020-10-06 DIAGNOSIS — O24419 Gestational diabetes mellitus in pregnancy, unspecified control: Secondary | ICD-10-CM | POA: Diagnosis present

## 2020-10-06 DIAGNOSIS — O99612 Diseases of the digestive system complicating pregnancy, second trimester: Secondary | ICD-10-CM | POA: Diagnosis not present

## 2020-10-06 DIAGNOSIS — R109 Unspecified abdominal pain: Secondary | ICD-10-CM

## 2020-10-06 LAB — URINALYSIS, COMPLETE (UACMP) WITH MICROSCOPIC
Bilirubin Urine: NEGATIVE
Glucose, UA: NEGATIVE mg/dL
Hgb urine dipstick: NEGATIVE
Ketones, ur: NEGATIVE mg/dL
Leukocytes,Ua: NEGATIVE
Nitrite: NEGATIVE
Protein, ur: NEGATIVE mg/dL
Specific Gravity, Urine: 1.001 — ABNORMAL LOW (ref 1.005–1.030)
pH: 7 (ref 5.0–8.0)

## 2020-10-06 LAB — CHLAMYDIA/GC NAA, CONFIRMATION
Chlamydia trachomatis, NAA: NEGATIVE
Neisseria gonorrhoeae, NAA: NEGATIVE

## 2020-10-06 LAB — RESP PANEL BY RT-PCR (FLU A&B, COVID) ARPGX2
Influenza A by PCR: NEGATIVE
Influenza B by PCR: NEGATIVE
SARS Coronavirus 2 by RT PCR: POSITIVE — AB

## 2020-10-06 MED ORDER — OXYCODONE HCL 5 MG PO TABS
5.0000 mg | ORAL_TABLET | Freq: Once | ORAL | Status: AC
  Administered 2020-10-06: 5 mg via ORAL
  Filled 2020-10-06: qty 1

## 2020-10-06 MED ORDER — OXYCODONE HCL 5 MG PO TABS: 5.0000 mg | ORAL_TABLET | Freq: Three times a day (TID) | ORAL | 0 refills | Status: AC | PRN

## 2020-10-06 MED ORDER — LACTATED RINGERS IV BOLUS: 1000.0000 mL | Freq: Once | INTRAVENOUS | Status: DC

## 2020-10-06 MED ORDER — LACTATED RINGERS IV SOLN: INTRAVENOUS | Status: DC

## 2020-10-06 MED ORDER — ONDANSETRON 4 MG PO TBDP
4.0000 mg | ORAL_TABLET | Freq: Once | ORAL | Status: AC
  Administered 2020-10-06: 4 mg via ORAL
  Filled 2020-10-06: qty 1

## 2020-10-06 NOTE — Discharge Summary (Addendum)
Physician Final Progress Note  Patient ID: Kristen Little MRN: [id number] DOB/AGE: 08-Aug-1995 25 y.o.  Admit date: 10/06/2020 Admitting provider: Gae Dry, MD Discharge date: 10/06/2020   Admission Diagnoses:  1) intrauterine pregnancy at [redacted]w[redacted]d 2) fever, abdominal pain, back pain, nausea, dizziness, not feeling baby move as much today  Discharge Diagnoses:  Principal Problem:   Pregnancy, supervision for, high-risk, unspecified trimester Active Problems:   DM (diabetes mellitus), gestational 08/10/20 @ 11 6/7 wks   Labor and delivery, indication for care   [redacted] weeks gestation of pregnancy   Abdominal pain affecting pregnancy   Back pain affecting pregnancy   Diarrhea during pregnancy   Fever   COVID-19 affecting pregnancy in second trimester    History of Present Illness: The patient is a 25y.o. female [phone] [id number] presents for not feeling well today. She has had fever, abdominal pain, back pain, nausea, vomiting, diarrhea. She does admit being able to keep some fluids down. She has not felt the baby move as much today. She denies vaginal bleeding or leakage of fluid. She has taken 3,000 mg of Tylenol since this morning without much relief.  She has had her care with [facility] to this point and is supposed to have her first appointment with [facility] in 2 days. This visit will have to be rescheduled.   Past Medical History:  Diagnosis Date   Irritable bowel syndrome (IBS)    Medical history non-contributory    Ovarian cyst    Vaginal Pap smear, abnormal     Past Surgical History:  Procedure Laterality Date   ADENOIDECTOMY W/ MYRINGOTOMY Bilateral    done as a child   CHOLECYSTECTOMY  05/27/2019   TONSILLECTOMY      No current facility-administered medications on file prior to encounter.   Current Outpatient Medications on File Prior to Encounter  Medication Sig Dispense Refill   Accu-Chek Softclix Lancets lancets 100 each by Other route 4 (four) times daily.  Use as instructed 100 each 12   acetaminophen (TYLENOL) 500 MG tablet Take 1,000 mg by mouth every 6 (six) hours as needed.     aspirin EC 81 MG tablet Take 81 mg by mouth daily. Swallow whole.     Blood Glucose Monitoring Suppl (ACCU-CHEK GUIDE ME) w/Device KIT See admin instructions.     glucose blood test strip Use as instructed to take blood sugar 4 times per day. 100 each 12   Prenatal Vit-Fe Fumarate-FA (PRENATAL MULTIVITAMIN) TABS tablet Take 1 tablet by mouth daily at 12 noon. 100 tablet 0   ondansetron (ZOFRAN ODT) 4 MG disintegrating tablet Take 1 tablet (4 mg total) by mouth every 8 (eight) hours as needed. (Patient not taking: No sig reported) 15 tablet 0    Allergies  Allergen Reactions   Azithromycin Nausea And Vomiting   Penicillins Other (See Comments)    States she had been noted to have allergy as a child- does not know what reaction was- has avoided taking it    Social History   Socioeconomic History   Marital status: Married    Spouse name: AnEllia Knowlton Number of children: 3   Years of education: 12+   Highest education level: Associate degree: occupational, teHotel manageror vocational program  Occupational History   Occupation: Homemaker  Tobacco Use   Smoking status: Never   Smokeless tobacco: Never   Tobacco comments:    Denies secondhand smoke.  Vaping Use   Vaping Use: Never  used  Substance and Sexual Activity   Alcohol use: No   Drug use: No   Sexual activity: Yes    Birth control/protection: None  Other Topics Concern   Not on file  Social History Narrative   Not on file   Social Determinants of Health   Financial Resource Strain: Low Risk    Difficulty of Paying Living Expenses: Not very hard  Food Insecurity: No Food Insecurity   Worried About Charity fundraiser in the Last Year: Never true   Ran Out of Food in the Last Year: Never true  Transportation Needs: No Transportation Needs   Lack of Transportation (Medical): No   Lack of  Transportation (Non-Medical): No  Physical Activity: Not on file  Stress: Not on file  Social Connections: Not on file  Intimate Partner Violence: Not At Risk   Fear of Current or Ex-Partner: No   Emotionally Abused: No   Physically Abused: No   Sexually Abused: No    Family History  Problem Relation Age of Onset   Heart disease Maternal Grandmother    Cancer Maternal Grandmother    Diabetes Maternal Grandmother    Cancer Maternal Grandfather    Hepatitis C Paternal Grandmother      Review of Systems  Constitutional:  Positive for fever and malaise/fatigue. Negative for chills.  HENT:  Negative for congestion, ear discharge, ear pain, hearing loss, sinus pain and sore throat.   Eyes:  Negative for blurred vision and double vision.  Respiratory:  Negative for cough, shortness of breath and wheezing.   Cardiovascular:  Negative for chest pain, palpitations and leg swelling.  Gastrointestinal:  Positive for abdominal pain, diarrhea, nausea and vomiting. Negative for blood in stool, constipation, heartburn and melena.  Genitourinary:  Negative for dysuria, flank pain, frequency, hematuria and urgency.  Musculoskeletal:  Positive for back pain. Negative for joint pain and myalgias.  Skin:  Negative for itching and rash.  Neurological:  Negative for dizziness, tingling, tremors, sensory change, speech change, focal weakness, seizures, loss of consciousness, weakness and headaches.  Endo/Heme/Allergies:  Negative for environmental allergies. Does not bruise/bleed easily.  Psychiatric/Behavioral:  Negative for depression, hallucinations, memory loss, substance abuse and suicidal ideas. The patient is not nervous/anxious and does not have insomnia.     Physical Exam: BP 120/64 (BP Location: Left Arm)   Pulse (!) 116   Temp 99.9 F (37.7 C) (Oral)   Resp 18   Ht _0  (1.626 m)   Wt 111.1 kg   LMP 05/06/2020 (Exact Date)   BMI 42.05 kg/m   Constitutional: Obese female in mild acute  distress.  HEENT: normal Skin: Warm and dry.  Cardiovascular: Tachycardic with regular rhythm Extremity:  no edema   Respiratory: Clear to auscultation bilateral. Normal respiratory effort Abdomen: FHT present Back: no CVAT Neuro: DTRs 2+, Cranial nerves grossly intact Psych: Alert and Oriented x3. No memory deficits. Normal mood and affect.   Toco: negative Fetal well being: 165 bpm with doppler   Consults: None  Significant Findings/ Diagnostic Studies: labs:  Results for KAIULANI, SITTON (MRN [id number]) as of 10/06/2020 22:10  Ref. Range 10/06/2020 19:40  RESP PANEL BY RT-PCR (FLU A&B, COVID) ARPGX2 Unknown Rpt (A)  Influenza A By PCR Latest Ref Range: NEGATIVE  NEGATIVE  Influenza B By PCR Latest Ref Range: NEGATIVE  NEGATIVE  SARS Coronavirus 2 by RT PCR Latest Ref Range: NEGATIVE  POSITIVE (A)  Appearance Latest Ref Range: CLEAR  HAZY (A)  Bilirubin Urine Latest Ref Range: NEGATIVE  NEGATIVE  Color, Urine Latest Ref Range: YELLOW  STRAW (A)  Glucose, UA Latest Ref Range: NEGATIVE mg/dL NEGATIVE  Hgb urine dipstick Latest Ref Range: NEGATIVE  NEGATIVE  Ketones, ur Latest Ref Range: NEGATIVE mg/dL NEGATIVE  Leukocytes,Ua Latest Ref Range: NEGATIVE  NEGATIVE  Nitrite Latest Ref Range: NEGATIVE  NEGATIVE  pH Latest Ref Range: 5.0 - 8.0  7.0  Protein Latest Ref Range: NEGATIVE mg/dL NEGATIVE  Specific Gravity, Urine Latest Ref Range: 1.005 - 1.030  1.001 (L)    Procedures: none  Hospital Course: The patient was admitted to Labor and Delivery Triage for observation.   Discharge Condition: good  Disposition: Discharge disposition: 01-Home or Self Care  Diet: Diabetic diet, stay hydrated  Discharge Activity: Activity as tolerated, increased rest, go to ER for severe Covid symptoms  Discharge Instructions     Discharge activity:   Complete by: As directed    As tolerated   Discharge diet:   Complete by: As directed    Diabetic carb modified   MyChart COVID-19 home  monitoring program   Complete by: Oct 07, 2020    Is the patient willing to use the [place] for home monitoring?: Yes      Allergies as of 10/06/2020       Reactions   Azithromycin Nausea And Vomiting   Penicillins Other (See Comments)   States she had been noted to have allergy as a child- does not know what reaction was- has avoided taking it        Medication List     STOP taking these medications    ondansetron 4 MG disintegrating tablet Commonly known as: Zofran ODT       TAKE these medications    Accu-Chek Guide Me w/Device Kit See admin instructions.   Accu-Chek Softclix Lancets lancets 100 each by Other route 4 (four) times daily. Use as instructed   acetaminophen 500 MG tablet Commonly known as: TYLENOL Take 1,000 mg by mouth every 6 (six) hours as needed.   aspirin EC 81 MG tablet Take 81 mg by mouth daily. Swallow whole.   glucose blood test strip Use as instructed to take blood sugar 4 times per day.   oxyCODONE 5 MG immediate release tablet Commonly known as: Roxicodone Take 1 tablet (5 mg total) by mouth every 8 (eight) hours as needed for up to 3 days.   prenatal multivitamin Tabs tablet Take 1 tablet by mouth daily at 12 noon.         Total time spent taking care of this patient: 24 minutes  Signed: Rod Can, CNM  10/06/2020, 10:18 PM

## 2020-10-06 NOTE — OB Triage Note (Signed)
Pt is a 24y/o G4P3 at [redacted]w[redacted]d with c/o nausea and diarrhea and abdominal and back pain. Pt states decreased ability to feel baby move. Pt denies LOF, CTX and VB. Monitors applied and assessing. Initial FHT 165.

## 2020-10-08 ENCOUNTER — Encounter: Payer: Medicaid Other | Admitting: Obstetrics & Gynecology

## 2020-10-22 ENCOUNTER — Other Ambulatory Visit: Payer: Self-pay | Admitting: Family Medicine

## 2020-10-22 DIAGNOSIS — O99212 Obesity complicating pregnancy, second trimester: Secondary | ICD-10-CM

## 2020-10-22 DIAGNOSIS — O0990 Supervision of high risk pregnancy, unspecified, unspecified trimester: Secondary | ICD-10-CM

## 2020-10-22 DIAGNOSIS — O24410 Gestational diabetes mellitus in pregnancy, diet controlled: Secondary | ICD-10-CM

## 2020-10-26 ENCOUNTER — Ambulatory Visit: Payer: Medicaid Other | Attending: Maternal & Fetal Medicine

## 2020-10-26 ENCOUNTER — Other Ambulatory Visit: Payer: Self-pay

## 2020-10-26 ENCOUNTER — Ambulatory Visit (HOSPITAL_BASED_OUTPATIENT_CLINIC_OR_DEPARTMENT_OTHER): Payer: Medicaid Other | Admitting: Maternal & Fetal Medicine

## 2020-10-26 DIAGNOSIS — O24410 Gestational diabetes mellitus in pregnancy, diet controlled: Secondary | ICD-10-CM | POA: Diagnosis not present

## 2020-10-26 DIAGNOSIS — E669 Obesity, unspecified: Secondary | ICD-10-CM

## 2020-10-26 DIAGNOSIS — Z363 Encounter for antenatal screening for malformations: Secondary | ICD-10-CM | POA: Insufficient documentation

## 2020-10-26 DIAGNOSIS — O0992 Supervision of high risk pregnancy, unspecified, second trimester: Secondary | ICD-10-CM

## 2020-10-26 DIAGNOSIS — Z3A22 22 weeks gestation of pregnancy: Secondary | ICD-10-CM

## 2020-10-26 DIAGNOSIS — O99212 Obesity complicating pregnancy, second trimester: Secondary | ICD-10-CM

## 2020-10-26 DIAGNOSIS — O0990 Supervision of high risk pregnancy, unspecified, unspecified trimester: Secondary | ICD-10-CM

## 2020-10-26 NOTE — Progress Notes (Signed)
  MFM consultation  Ms. Mallis is a G4P3 here for a detailed examination and consultation regarding new diagnosis of elevated BMI and A1GDM.  Her prenatal care has been unremarkable with exception of a new diagnosis of GDM. She had a neg quad screen and considering a cell free DNA at her next visit with [facility].  She denies significant medical history beside 3 prior vaginal deliveres. She denies substance abuse.   Her blood pressure was 122/80 mmHg.  Single intrauterine pregnancy here for a detailed anatomy due to elevated BMI and A1GDM. Normal anatomy with measurements consistent with dates There is good fetal movement and amniotic fluid volume Suboptimal views of the fetal anatomy were obtained secondary to fetal position.  She is taking low dose ASA.  She has had diabetic teaching and is aware of her FBS and 1hr PP goals, which she affirmed that she meets.   We discussed the mainstay of management to include nutrition, exercise and medical therapy. We discussed the increased risk for fetal macrosomia, preeclampsia, cesarean delivery, shoulder dysotcia and neonatal admission for hypoglycemia, in pregnancies where the maternal blood sugar is not well controlled.   Given her early diagnosis of GDM-consider a hgb A1c for risk for T2DM, if positive consider a screening fetal echocardiogram with a pediatric cardiologist.  We recommend serial growth every 4 weeks throughout the pregnancy. We also discussed that if > 50% of her blood sugars are abnormal then initiating medical therapy is indicated.   Lastly, we would initiate weekly testing at 32 weeks with plan for delivery between 37-39 weeks if medical therapy is initiated.  Regarding her elevated BMI we discussed the goal of 11-20 lb weight gain. We discuss that there is an overlap between elevated BMI and A1GDM in terms of complications in pregnancy especially regarding preeclampsia and fetal macrosomia  Recommendations:  Follow up  growth and anatomy in 4 weeks Hgb A1c  I spent 30 minutes with > 50% in face to face consultation.

## 2020-10-29 ENCOUNTER — Ambulatory Visit (INDEPENDENT_AMBULATORY_CARE_PROVIDER_SITE_OTHER): Payer: Medicaid Other | Admitting: Obstetrics & Gynecology

## 2020-10-29 ENCOUNTER — Other Ambulatory Visit: Payer: Self-pay

## 2020-10-29 ENCOUNTER — Encounter: Payer: Self-pay | Admitting: Obstetrics & Gynecology

## 2020-10-29 VITALS — BP 120/80 | Wt 249.0 lb

## 2020-10-29 DIAGNOSIS — O0992 Supervision of high risk pregnancy, unspecified, second trimester: Secondary | ICD-10-CM

## 2020-10-29 DIAGNOSIS — O99210 Obesity complicating pregnancy, unspecified trimester: Secondary | ICD-10-CM

## 2020-10-29 DIAGNOSIS — Z3A23 23 weeks gestation of pregnancy: Secondary | ICD-10-CM

## 2020-10-29 DIAGNOSIS — Z1379 Encounter for other screening for genetic and chromosomal anomalies: Secondary | ICD-10-CM

## 2020-10-29 DIAGNOSIS — O24410 Gestational diabetes mellitus in pregnancy, diet controlled: Secondary | ICD-10-CM

## 2020-10-29 NOTE — Progress Notes (Signed)
10/29/2020   Chief Complaint: Missed period  Transfer of Care Patient: from [facility] (due to high risk, also easier location)  History of Present Illness: Kristen Little is a 25 y.o. [phone] [redacted]w[redacted]d based on Patient's last menstrual period was 05/06/2020 (exact date). with an Estimated Date of Delivery: 02/23/21, with the above CC.   She is pregnant with her fourth daughter.  SHe has obesity and early onset gest DM (diet controlled).  She has seen MFM, they are following as well.  Past 2 deliveries have been traumatic to patient, with prolonged second stage and stitch repairs.  ROS: A 12-point review of systems was performed and negative, except as stated in the above HPI.  OBGYN History: As per HPI. OB History  Gravida Para Term Preterm AB Living  $Remov'4 3 3 'yHvBjQ$ 0 0 3  SAB IAB Ectopic Multiple Live Births  0 0 0 0 3    # Outcome Date GA Lbr Len/2nd Weight Sex Delivery Anes PTL Lv  4 Current           3 Term 12/31/17 [redacted]w[redacted]d  7 lb 4 oz (3.289 kg) F Vag-Spont   LIV  2 Term 06/10/15 [redacted]w[redacted]d  7 lb 4.8 oz (3.31 kg) F Vag-Spont EPI, Local  LIV  1 Term 04/10/11   6 lb 4 oz (2.835 kg) F Vag-Spont EPI N LIV    Obstetric Comments  Observed for high BP- resolved, Vaginal bleeding- query post v/e, decreased Fetal movement- resolved, right side Ovarian cyst- resolved    Any issues with any prior pregnancies: yes Any prior children are healthy, doing well, without any problems or issues: yes History of pap smears: Yes. Last pap smear ASCUS, concerned, needs repeated post partum. Abnormal: yes  History of STIs: No   Past Medical History: Past Medical History:  Diagnosis Date   Irritable bowel syndrome (IBS)    Medical history non-contributory    Ovarian cyst    Vaginal Pap smear, abnormal     Past Surgical History: Past Surgical History:  Procedure Laterality Date   ADENOIDECTOMY W/ MYRINGOTOMY Bilateral    done as a child   CHOLECYSTECTOMY  05/27/2019   TONSILLECTOMY      Family History:  Family  History  Problem Relation Age of Onset   Heart disease Maternal Grandmother    Cancer Maternal Grandmother    Diabetes Maternal Grandmother    Cancer Maternal Grandfather    Hepatitis C Paternal Grandmother    She denies any female cancers, bleeding or blood clotting disorders.  She denies any history of mental retardation, birth defects or genetic disorders in her or the FOB's history  Social History:  Social History   Socioeconomic History   Marital status: Married    Spouse name: Lenyx Boody   Number of children: 3   Years of education: 12+   Highest education level: Associate degree: occupational, Hotel manager, or vocational program  Occupational History   Occupation: Homemaker  Tobacco Use   Smoking status: Never   Smokeless tobacco: Never   Tobacco comments:    Denies secondhand smoke.  Vaping Use   Vaping Use: Never used  Substance and Sexual Activity   Alcohol use: No   Drug use: No   Sexual activity: Yes    Birth control/protection: None  Other Topics Concern   Not on file  Social History Narrative   Not on file   Social Determinants of Health   Financial Resource Strain: Low Risk    Difficulty of  Paying Living Expenses: Not very hard  Food Insecurity: No Food Insecurity   Worried About [place] in the Last Year: Never true   Ran Out of Food in the Last Year: Never true  Transportation Needs: No Transportation Needs   Lack of Transportation (Medical): No   Lack of Transportation (Non-Medical): No  Physical Activity: Not on file  Stress: Not on file  Social Connections: Not on file  Intimate Partner Violence: Not At Risk   Fear of Current or Ex-Partner: No   Emotionally Abused: No   Physically Abused: No   Sexually Abused: No   Any pets in the household: no   Allergy: Allergies  Allergen Reactions   Azithromycin Nausea And Vomiting   Penicillins Other (See Comments)    States she had been noted to have allergy as a child- does  not know what reaction was- has avoided taking it    Current Outpatient Medications:  Current Outpatient Medications:    Accu-Chek Softclix Lancets lancets, 100 each by Other route 4 (four) times daily. Use as instructed, Disp: 100 each, Rfl: 12   acetaminophen (TYLENOL) 500 MG tablet, Take 1,000 mg by mouth every 6 (six) hours as needed., Disp: , Rfl:    aspirin EC 81 MG tablet, Take 81 mg by mouth daily. Swallow whole., Disp: , Rfl:    Blood Glucose Monitoring Suppl (ACCU-CHEK GUIDE ME) w/Device KIT, See admin instructions., Disp: , Rfl:    glucose blood test strip, Use as instructed to take blood sugar 4 times per day., Disp: 100 each, Rfl: 12   Prenatal Vit-Fe Fumarate-FA (PRENATAL MULTIVITAMIN) TABS tablet, Take 1 tablet by mouth daily at 12 noon., Disp: 100 tablet, Rfl: 0   Physical Exam:   BP 120/80   Wt 249 lb (112.9 kg)   LMP 05/06/2020 (Exact Date)   BMI 42.74 kg/m  Body mass index is 42.74 kg/m. Constitutional: Well nourished, well developed female in no acute distress.  Neck:  Supple, normal appearance, and no thyromegaly  Cardiovascular: S1, S2 normal, no murmur, rub or gallop, regular rate and rhythm Respiratory:  Clear to auscultation bilateral. Normal respiratory effort Abdomen: FHT 140s positive bowel sounds and no masses, hernias; diffusely non tender to palpation, non distended Breasts: breasts appear normal, no suspicious masses, no skin or nipple changes or axillary nodes. Neuro/Psych:  Normal mood and affect.  Skin:  Warm and dry.  Lymphatic:  No inguinal lymphadenopathy.    Assessment: Ms. Hoes is a 25 y.o. [phone] [redacted]w[redacted]d based on Patient's last menstrual period was 05/06/2020 (exact date). with an Estimated Date of Delivery: 02/23/21,  for TRANSFER of prenatal care.  Plan:  1) Avoid alcoholic beverages. 2) Patient encouraged not to smoke.  3) Discontinue the use of all non-medicinal drugs and chemicals.  4) Take prenatal vitamins daily.  5) Seatbelt use  advised 6) Nutrition, food safety (fish, cheese advisories, and high nitrite foods) and exercise discussed. 7) Hospital and practice style delivering at [facility] discussed  8) Patient is asked about travel to areas at risk for the [place] virus, and counseled to avoid travel and exposure to mosquitoes or sexual partners who may have themselves been exposed to the virus. Testing is discussed, and will be ordered as appropriate.  9) [facility]birth classes at [facility] advised 10) Genetic Screening, such as with 1st Trimester Screening, cell free fetal DNA, AFP testing, and Ultrasound, as well as with amniocentesis and CVS as appropriate, is discussed with patient. She plans to have  genetic testing this pregnancy at this time now. 11) GDM discussed, cont accuchecks and diet 12) obesity RF discussed [place], ASA, Anes consult 34 weeks 13) PAP pp 14) Considering CS for prior birth trauma. Counseled vag delivery preferred route of delivery 15) considering BTL for birth control. Counseled on its permanency.  Sign tubal papers in case she continues to desire this contraception at delivery.  Problem list reviewed and updated.  Barnett Applebaum, MD, [facility], [facility] 10/29/2020  3:50 PM

## 2020-10-29 NOTE — Patient Instructions (Signed)
Thank you for choosing [facility]. As part of our ongoing efforts to improve patient experience, we would appreciate your feedback. Please fill out the short survey that you will receive by mail or MyChart. Your opinion is important to us! -Dr Reta Norgren  Genetic Testing During Pregnancy Why is genetic testing done? Genetic testing during pregnancy is also called prenatal genetic testing. This type of testing can determine if your baby is at risk of being born with a disorder caused by abnormal genes or chromosomes (genetic disorder). Chromosomes contain genes that control how your baby will develop in your womb. There are many different genetic disorders. Examples of genetic disorders that may be found through genetic testing include Down syndrome and cysticfibrosis. Gene changes (mutations) can be passed down through families. Genetic testing is offered to women during pregnancy. You can choose whether to have genetic testing. Having genetic testing allows you to: Discuss your test results and options with your health care provider. Prepare for a baby that may be born with a genetic disorder. Learning about the disorder ahead of time helps you be better prepared to manage it. Your health care providers can also be prepared in case your baby requires special care before or after birth. Consider whether you want to continue with the pregnancy. In some cases, genetic testing may be done to learn about the traits a childwill inherit. Types of genetic tests There are two basic types of genetic testing. Screening tests indicate whether your developing baby (fetus) is at higher risk for a genetic disorder. Diagnostic tests check actual fetalcells to diagnose a genetic disorder. Screening tests     Screening tests will not harm your baby. They are recommended for all pregnant women. Types of screening tests include: Carrier screening. This test involves checking genes from both parents by testing their  blood or saliva. The test checks to find out if the parents carry a genetic mutation that may be passed to a baby. In most cases, both parents must carry the mutation for a baby to be at risk. First trimester screening. This test combines a blood test with sound wave imaging of your baby (fetal ultrasound). This screening test checks for a risk of Down syndrome or other defects caused by having extra chromosomes. The ultrasound also checks for defects of the heart, abdomen, or skeleton. Second trimester screening also combines a blood test with a fetal ultrasound exam. This test checks for a risk of Down syndrome or other defects caused by having extra chromosomes. The ultrasound allows your health care provider to look for genetic defects of the face, brain, spine, heart, or limbs. Some women may choose to only have an ultrasound exam without a blood test. Combined or sequential screening. This type of testing combines the results of first and second trimester screening. This type of testing may be more accurate than first or second trimester screening alone. Cell-free DNA testing. This is a blood test that detects cells released by the placenta that get into the mother's blood. It can be used to check for a risk of Down syndrome, other extra chromosome syndromes, and disorders caused by abnormal numbers of sex chromosomes. This test can be done any time after 10 weeks of pregnancy.  Diagnostic tests Diagnostic tests carry slight risks of problems, including bleeding, infection, and loss of the pregnancy. These tests are done only if your baby is at risk for a genetic disorder. Your health care provider will discuss the risks and benefits of having   diagnostic tests before performing these types of tests. Examples of diagnostic tests include: Chorionic villus sampling (CVS). This involves a procedure to remove and test a sample of cells taken from the placenta. The procedure may be done between 10 and 12  weeks of pregnancy. Amniocentesis. This involves a procedure to remove and test a sample of fluid (amniotic fluid) and cells from the sac that surrounds the developing baby. The procedure may be done any time during the pregnancy, but it is usually done between 15 and 20 weeks of pregnancy. What do the results mean? For a screening test: If the results are negative, it often means that your child is not at higher risk. There is still a slight chance your child could have a genetic disorder. If the results are positive, it does not mean your child will have a genetic disorder. It may mean that your child has a higher-than-normal risk for a genetic disorder. In that case, you should talk with your health care provider about whether you should have diagnostic genetic tests. For a diagnostic test: If the result is negative, it is unlikely that your child will have a genetic disorder. If the test is positive for a genetic disorder, it is likely that your child will have the disorder. The test may not tell how severe the disorder will be. Talk with your health care provider about your options. Talk with your health care provider about what your results mean. Questions to ask your health care provider Before talking to your health care provider about genetic testing, find out if there is a history of genetic disorders in your family. It may also help to know your family's ethnic origins. Then ask your health care provider the following questions: Is my baby at risk for a genetic disorder? What are the benefits of having genetic screening? What tests are best for me and my baby? What are the risks of each test? If I get a positive result on a screening test, what is the next step? Should I meet with a genetic counselor? Should my partner or other members of my family be tested? How much do the tests cost? Will my insurance cover the testing? Summary Genetic testing is done during pregnancy to find out  whether your child is at risk for a genetic disorder. Genetic testing is offered to women during pregnancy. You can choose whether to have genetic testing. There are two basic types of genetic testing. Screening tests indicate whether your developing baby (fetus) is at higher risk for a genetic disorder. Diagnostic tests check actual fetal cells to diagnose a genetic disorder. If a diagnostic genetic test is positive, talk with your health care provider about your options. This information is not intended to replace advice given to you by your health care provider. Make sure you discuss any questions you have with your healthcare provider. Document Revised: 08/22/2019 Document Reviewed: 08/22/2019 Elsevier Patient Education  2022 [employer]vier Inc.  

## 2020-10-30 LAB — HEMOGLOBIN A1C
Est. average glucose Bld gHb Est-mCnc: 97 mg/dL
Hgb A1c MFr Bld: 5 % (ref 4.8–5.6)

## 2020-11-04 LAB — MATERNIT21 PLUS CORE+SCA
Fetal Fraction: 14
Monosomy X (Turner Syndrome): NOT DETECTED
Result (T21): NEGATIVE
Trisomy 13 (Patau syndrome): NEGATIVE
Trisomy 18 (Edwards syndrome): NEGATIVE
Trisomy 21 (Down syndrome): NEGATIVE
XXX (Triple X Syndrome): NOT DETECTED
XXY (Klinefelter Syndrome): NOT DETECTED
XYY (Jacobs Syndrome): NOT DETECTED

## 2020-11-11 ENCOUNTER — Encounter: Payer: Medicaid Other | Admitting: Obstetrics and Gynecology

## 2020-11-12 ENCOUNTER — Other Ambulatory Visit: Payer: Self-pay | Admitting: Maternal & Fetal Medicine

## 2020-11-12 DIAGNOSIS — O24410 Gestational diabetes mellitus in pregnancy, diet controlled: Secondary | ICD-10-CM

## 2020-11-12 DIAGNOSIS — O99212 Obesity complicating pregnancy, second trimester: Secondary | ICD-10-CM

## 2020-11-16 ENCOUNTER — Encounter: Payer: Self-pay | Admitting: Obstetrics & Gynecology

## 2020-11-16 ENCOUNTER — Ambulatory Visit (INDEPENDENT_AMBULATORY_CARE_PROVIDER_SITE_OTHER): Payer: Medicaid Other | Admitting: Obstetrics & Gynecology

## 2020-11-16 ENCOUNTER — Other Ambulatory Visit: Payer: Self-pay

## 2020-11-16 VITALS — BP 120/80 | Wt 253.0 lb

## 2020-11-16 DIAGNOSIS — O0992 Supervision of high risk pregnancy, unspecified, second trimester: Secondary | ICD-10-CM

## 2020-11-16 DIAGNOSIS — O99210 Obesity complicating pregnancy, unspecified trimester: Secondary | ICD-10-CM

## 2020-11-16 DIAGNOSIS — Z3A25 25 weeks gestation of pregnancy: Secondary | ICD-10-CM

## 2020-11-16 DIAGNOSIS — O24410 Gestational diabetes mellitus in pregnancy, diet controlled: Secondary | ICD-10-CM

## 2020-11-16 LAB — POCT URINALYSIS DIPSTICK OB
Glucose, UA: NEGATIVE
POC,PROTEIN,UA: NEGATIVE

## 2020-11-16 NOTE — Patient Instructions (Signed)

## 2020-11-16 NOTE — Progress Notes (Signed)
Prenatal Visit Note Date: 11/16/2020 Clinic: [facility]  Subjective:  Kristen Little is a 25 y.o. [phone] at [redacted]w[redacted]d being seen today for ongoing prenatal care.  She is currently monitored for the following issues for this high-risk pregnancy and has Abnormal uterine bleeding; Migraines; Pregnancy, supervision for, high-risk, unspecified trimester; Abnormal glucose affecting pregnancy; DM (diabetes mellitus), gestational 08/10/20 @ 11 6/7 wks; UTI (urinary tract infection) during pregnancy 08/06/20 50-100,000 Enterococcus faecalis; Obesity in pregnancy BMI=43.9; Labor and delivery, indication for care; [redacted] weeks gestation of pregnancy; Abdominal pain affecting pregnancy; Back pain affecting pregnancy; Diarrhea during pregnancy; Fever; and COVID-19 affecting pregnancy in second trimester on their problem list.  Patient reports FBS elevated (98-110) and >90% PPBS normal (Log reviewed by self).  Denies any concerning sx's.  .   Contractions: Not present. Vag. Bleeding: None.  Movement: Present. Denies leaking of fluid.   The following portions of the patient's history were reviewed and updated as appropriate: allergies, current medications, past family history, past medical history, past social history, past surgical history and problem list. Problem list updated.  Objective:   Vitals:   11/16/20 1604  BP: 120/80  Weight: 253 lb (114.8 kg)    Fetal Status:     Movement: Present     General:  Alert, oriented and cooperative. Patient is in no acute distress.  Skin: Skin is warm and dry. No rash noted.   Cardiovascular: Normal heart rate noted  Respiratory: Normal respiratory effort, no problems with respiration noted  Abdomen: Soft, gravid, appropriate for gestational age. Pain/Pressure: Present     Pelvic:  Cervical exam deferred        Extremities: Normal range of motion.     Mental Status: Normal mood and affect. Normal behavior. Normal judgment and thought content.   Urinalysis:     NEG  Assessment and Plan:  Pregnancy: G4P3003 at [redacted]w[redacted]d  1. High-risk pregnancy, second trimester - PNV, FMC - POC Urinalysis Dipstick OB  2. [redacted] weeks gestation of pregnancy  3. Obesity in pregnancy, antepartum BMI >=40 [ ]  anesthesia consult (early and late if BMI > 45) [x ] u/s for dating [x ]  [x ] nutritional goals [x ] folic acid 1mg  [x ] bASA (>12 weeks) [ ]  consider nutrition consult [ ]  consider maternal EKG 1st trimester [x ] Growth u/s 28 [ ] , 32 [ ] , 36 weeks [ ]  [x ] NST/AFI weekly 34+ weeks (34[] ,35[] ,36[] , 37[] , 38[] , 39[] , 40[] ) [x ] IOL by 41 weeks (scheduled, prn [] )  4. Diet controlled gestational diabetes mellitus (GDM) in second trimester - Consider evening Insulin if fasting BS cont to be elevated - Discussed risks of diabetes and pregnancy - Delivery planning, pt considering CS for prior birth trauma, macrosomia concerns now - MFM monthly for growth     Current Diabetic Medications:  None   [x ]ordered meter, strips, lancets   Diabetic Teaching for A1GDM or A2GDM: [ ]  Lifestyles at [facility]  [x ] [facility] DM clinic=08/31/20 [ ]  WIC MNT   Baseline and surveillance labs (pulled in from [facility], refresh links as needed)  Lab Results  Component Value Date   CREATININE 0.68 08/06/2020   AST 12 08/06/2020   ALT 12 08/06/2020   TSH 2.250 08/06/2020   Lab Results  Component Value Date   HGBA1C 5.2 08/06/2020    Antenatal Testing Class of DM U/S NST/AFI DELIVERY  Diabetes   A1 - good control - O24.410    A2 - good control -  O24.419      A2  - poor control or poor compliance - O24.419, E11.65   (Macrosomia or polyhydramnios) **E11.65 is extra code for poor control**      20-36  20-36  20-24-28-32-36     40  32//2 x wk  32//2 x wk     40  39  PRN      Postpartum:  [ ]  2 hr GTT needed [ ]  Counsel patient on increased lifetime risk of diabetes and recommend regular DM screeing      Return in about 4 weeks (around 12/14/2020)  for HROB, also 6 and 8 weeks (all MDs to see pt).  Preterm labor symptoms and general obstetric precautions including but not limited to vaginal bleeding, contractions, leaking of fluid and fetal movement were reviewed in detail with the patient. Please refer to After Visit Summary for other counseling recommendations.   , MD, 13/02/2020 Ob/Gyn, [facility] 11/16/2020  4:41 PM

## 2020-11-23 ENCOUNTER — Other Ambulatory Visit: Payer: Self-pay

## 2020-11-23 ENCOUNTER — Ambulatory Visit: Payer: Medicaid Other | Attending: Obstetrics

## 2020-11-23 DIAGNOSIS — O99212 Obesity complicating pregnancy, second trimester: Secondary | ICD-10-CM | POA: Diagnosis not present

## 2020-11-23 DIAGNOSIS — O24410 Gestational diabetes mellitus in pregnancy, diet controlled: Secondary | ICD-10-CM | POA: Diagnosis not present

## 2020-11-23 DIAGNOSIS — Z3A26 26 weeks gestation of pregnancy: Secondary | ICD-10-CM | POA: Insufficient documentation

## 2020-11-23 DIAGNOSIS — E669 Obesity, unspecified: Secondary | ICD-10-CM

## 2020-11-26 ENCOUNTER — Ambulatory Visit (INDEPENDENT_AMBULATORY_CARE_PROVIDER_SITE_OTHER): Payer: Medicaid Other | Admitting: Obstetrics & Gynecology

## 2020-11-26 ENCOUNTER — Encounter: Payer: Self-pay | Admitting: Obstetrics & Gynecology

## 2020-11-26 ENCOUNTER — Other Ambulatory Visit: Payer: Self-pay

## 2020-11-26 VITALS — BP 120/80 | Wt 254.0 lb

## 2020-11-26 DIAGNOSIS — Z3A27 27 weeks gestation of pregnancy: Secondary | ICD-10-CM

## 2020-11-26 DIAGNOSIS — O0992 Supervision of high risk pregnancy, unspecified, second trimester: Secondary | ICD-10-CM

## 2020-11-26 DIAGNOSIS — O99210 Obesity complicating pregnancy, unspecified trimester: Secondary | ICD-10-CM

## 2020-11-26 DIAGNOSIS — O24410 Gestational diabetes mellitus in pregnancy, diet controlled: Secondary | ICD-10-CM

## 2020-11-26 LAB — POCT URINALYSIS DIPSTICK OB
Glucose, UA: NEGATIVE
POC,PROTEIN,UA: NEGATIVE

## 2020-11-26 NOTE — Progress Notes (Signed)
  Subjective  Fetal Movement? yes Contractions? no Leaking Fluid? no Vaginal Bleeding? no BS LOG- FBS 100-110, Post Prandial BS all WNL  Objective  BP 120/80   Wt 254 lb (115.2 kg)   LMP 05/06/2020 (Exact Date)   BMI 43.60 kg/m  General: NAD Pumonary: no increased work of breathing Abdomen: gravid, non-tender Extremities: no edema Psychiatric: mood appropriate, affect full  Assessment  25 y.o. [id number] at 100w2d by  02/23/2021, by Ultrasound presenting for routine prenatal visit  Plan   Problem List Items Addressed This Visit     High-risk pregnancy, second trimester     -  PNV, FMC   Relevant Orders   POC Urinalysis Dipstick OB (Completed)   Obesity in pregnancy, antepartum      - APT  - Growth [place] (MFM)   Diet controlled gestational diabetes mellitus (GDM) in second trimester      - Cont monitoring for now  - Consider Insulin if range increases   [redacted] weeks gestation of pregnancy        Annamarie Major, MD, [facility], [facility] 11/26/2020  4:16 PM

## 2020-12-14 ENCOUNTER — Ambulatory Visit (INDEPENDENT_AMBULATORY_CARE_PROVIDER_SITE_OTHER): Payer: Medicaid Other | Admitting: Obstetrics & Gynecology

## 2020-12-14 ENCOUNTER — Encounter: Payer: Self-pay | Admitting: Obstetrics & Gynecology

## 2020-12-14 DIAGNOSIS — O24410 Gestational diabetes mellitus in pregnancy, diet controlled: Secondary | ICD-10-CM

## 2020-12-14 DIAGNOSIS — O99210 Obesity complicating pregnancy, unspecified trimester: Secondary | ICD-10-CM

## 2020-12-14 DIAGNOSIS — Z3A29 29 weeks gestation of pregnancy: Secondary | ICD-10-CM | POA: Diagnosis not present

## 2020-12-14 DIAGNOSIS — O99213 Obesity complicating pregnancy, third trimester: Secondary | ICD-10-CM | POA: Diagnosis not present

## 2020-12-14 DIAGNOSIS — E669 Obesity, unspecified: Secondary | ICD-10-CM | POA: Diagnosis not present

## 2020-12-14 DIAGNOSIS — O0992 Supervision of high risk pregnancy, unspecified, second trimester: Secondary | ICD-10-CM

## 2020-12-14 NOTE — Progress Notes (Signed)
Virtual Visit via Telephone Note  I connected with patient on 12/14/20 at  4:10 PM EDT by telephone and verified that I am speaking with the correct person using two identifiers.   I discussed the limitations, risks, security and privacy concerns of performing an evaluation and management service by telephone and the availability of in person appointments. I also discussed with the patient that there may be a patient responsible charge related to this service. The patient expressed understanding and agreed to proceed.  The patient was at home (Covid exposure) I spoke with the patient from my  [place] is a 25 y.o. [phone] at [redacted]w[redacted]d being seen today for ongoing prenatal care.  She is currently monitored for the following issues for this high-risk pregnancy and has Abnormal uterine bleeding; Migraines; Pregnancy, supervision for, high-risk, unspecified trimester; Abnormal glucose affecting pregnancy; DM (diabetes mellitus), gestational 08/10/20 @ 11 6/7 wks; UTI (urinary tract infection) during pregnancy 08/06/20 50-100,000 Enterococcus faecalis; Obesity in pregnancy BMI=43.9; Abdominal pain affecting pregnancy; Back pain affecting pregnancy; Fever; and COVID-19 affecting pregnancy in second trimester on their problem list.  ----------------------------------------------------------------------------------- Patient reports no complaints.   Denies pain, VB, leaking of fluid.  FBS now in normal range (has increased exercise) ----------------------------------------------------------------------------------- The following portions of the patient's history were reviewed and updated as appropriate: allergies, current medications, past family history, past medical history, past social history, past surgical history and problem list. Problem list updated.   Objective  Last menstrual period 05/06/2020. Pregravid weight 256 lb (116.1 kg) Total Weight Gain -2 lb (-0.907 kg)  Physical Exam could  not be performed. Because of the COVID-19 outbreak this visit was performed over the phone and not in person.   Assessment   25 y.o. [id number] at [redacted]w[redacted]d by  02/23/2021, by Ultrasound presenting for routine prenatal visit  Plan      Nursing Staff Provider  Office Location  [facility] Dating  07/02/20- est gestational age [redacted] weeks 2 days  [facility] 02/23/2021. Small to moderate subchorionic hemorrhage.  Language  English Anatomy US    Flu Vaccine  Declined, 08/06/20 Genetic Screen  NIPS:   AFP:    First Screen:   Quad: 09/03/20 = Negative    TDaP vaccine    Hgb A1C or  GTT Early, 08/06/20 = 161 (elevated 3hr gtt) Third trimester   COVID vaccine Declined, 08/06/20    Rhogam     LAB RESULTS   Feeding Plan Breast Blood Type   B positive     (08/06/20)  Contraception BTL- booklet given 08/06/20 Antibody  Negative        (08/06/20)  Circumcision  Rubella  MMR x 2, 07/25/2020  [place] RPR   Non-reactive   (08/06/20)  Support Person  HBsAg   Negative       (08/06/20)  Prenatal Classes  HIV  Non-reactive   (08/06/20)  $RemoveBe'@28wk'uxjczmClo$ - Doula referral?  Varicella Varicella x 2, 04/12/2011    HCV  Negative         (08/06/20)  BTL Consent  GBS  (For PCN allergy, check sensitivities)        VBAC Consent  Pap  due 12/2020 - do postpartum        BP Cuff ordered     Delivery Group  [facility]    [facility]           Gestational age appropriate obstetric precautions including but not limited to vaginal bleeding, contractions, leaking of fluid and  fetal movement were reviewed in detail with the patient.     Follow Up Instructions: 2 weeks   I discussed the assessment and treatment plan with the patient. The patient was provided an opportunity to ask questions and all were answered. The patient agreed with the plan and demonstrated an understanding of the instructions.   The patient was advised to call back or seek an in-person evaluation if the symptoms worsen or if the condition fails to improve as  anticipated.  I provided 20 minutes of non-face-to-face time during this encounter.  Return for As scheduled.  Barnett Applebaum, MD [facility], [facility] 12/14/2020 4:31 PM

## 2020-12-23 ENCOUNTER — Other Ambulatory Visit: Payer: Self-pay

## 2020-12-23 DIAGNOSIS — O99213 Obesity complicating pregnancy, third trimester: Secondary | ICD-10-CM

## 2020-12-23 DIAGNOSIS — O24410 Gestational diabetes mellitus in pregnancy, diet controlled: Secondary | ICD-10-CM

## 2020-12-28 ENCOUNTER — Other Ambulatory Visit: Payer: Self-pay | Admitting: Obstetrics

## 2020-12-28 ENCOUNTER — Other Ambulatory Visit: Payer: Self-pay

## 2020-12-28 ENCOUNTER — Ambulatory Visit (INDEPENDENT_AMBULATORY_CARE_PROVIDER_SITE_OTHER): Payer: Medicaid Other | Admitting: Obstetrics and Gynecology

## 2020-12-28 ENCOUNTER — Ambulatory Visit (HOSPITAL_BASED_OUTPATIENT_CLINIC_OR_DEPARTMENT_OTHER): Payer: Medicaid Other | Admitting: Maternal & Fetal Medicine

## 2020-12-28 ENCOUNTER — Ambulatory Visit: Payer: Medicaid Other | Attending: Maternal & Fetal Medicine

## 2020-12-28 ENCOUNTER — Encounter: Payer: Self-pay | Admitting: Obstetrics and Gynecology

## 2020-12-28 VITALS — BP 115/70 | HR 109 | Temp 98.1°F | Ht 64.0 in | Wt 251.0 lb

## 2020-12-28 VITALS — BP 120/80 | Wt 251.0 lb

## 2020-12-28 DIAGNOSIS — O24410 Gestational diabetes mellitus in pregnancy, diet controlled: Secondary | ICD-10-CM

## 2020-12-28 DIAGNOSIS — U071 COVID-19: Secondary | ICD-10-CM

## 2020-12-28 DIAGNOSIS — O99810 Abnormal glucose complicating pregnancy: Secondary | ICD-10-CM

## 2020-12-28 DIAGNOSIS — O99891 Other specified diseases and conditions complicating pregnancy: Secondary | ICD-10-CM

## 2020-12-28 DIAGNOSIS — O99213 Obesity complicating pregnancy, third trimester: Secondary | ICD-10-CM | POA: Diagnosis not present

## 2020-12-28 DIAGNOSIS — E669 Obesity, unspecified: Secondary | ICD-10-CM

## 2020-12-28 DIAGNOSIS — Z3A31 31 weeks gestation of pregnancy: Secondary | ICD-10-CM | POA: Diagnosis not present

## 2020-12-28 DIAGNOSIS — O0990 Supervision of high risk pregnancy, unspecified, unspecified trimester: Secondary | ICD-10-CM

## 2020-12-28 DIAGNOSIS — M549 Dorsalgia, unspecified: Secondary | ICD-10-CM

## 2020-12-28 DIAGNOSIS — O26899 Other specified pregnancy related conditions, unspecified trimester: Secondary | ICD-10-CM

## 2020-12-28 DIAGNOSIS — O0993 Supervision of high risk pregnancy, unspecified, third trimester: Secondary | ICD-10-CM

## 2020-12-28 NOTE — Progress Notes (Signed)
Routine Prenatal Care Visit  Subjective  Kristen Little is a 25 y.o. [phone] at [redacted]w[redacted]d being seen today for ongoing prenatal care.  She is currently monitored for the following issues for this high-risk pregnancy and has Abnormal uterine bleeding; Migraines; Pregnancy, supervision for, high-risk, unspecified trimester; Abnormal glucose affecting pregnancy; DM (diabetes mellitus), gestational 08/10/20 @ 11 6/7 wks; UTI (urinary tract infection) during pregnancy 08/06/20 50-100,000 Enterococcus faecalis; Obesity in pregnancy BMI=43.9; Abdominal pain affecting pregnancy; Back pain affecting pregnancy; Fever; and COVID-19 affecting pregnancy in second trimester on their problem list.  ----------------------------------------------------------------------------------- Patient reports no complaints.   Contractions: Irregular. Vag. Bleeding: None.  Movement: Present. Leaking Fluid denies.  BG log: mostly normal blood glucose values. Well > 80% ----------------------------------------------------------------------------------- The following portions of the patient's history were reviewed and updated as appropriate: allergies, current medications, past family history, past medical history, past social history, past surgical history and problem list. Problem list updated.  Objective  Blood pressure 120/80, weight 251 lb (113.9 kg), last menstrual period 05/06/2020. Pregravid weight 256 lb (116.1 kg) Total Weight Gain -5 lb (-2.268 kg) Urinalysis: Urine Protein    Urine Glucose    Fetal Status: Fetal Heart Rate (bpm): RNST   Movement: Present      NST done today with MFM as part of BPP, called REactive  General:  Alert, oriented and cooperative. Patient is in no acute distress.  Skin: Skin is warm and dry. No rash noted.   Cardiovascular: Normal heart rate noted  Respiratory: Normal respiratory effort, no problems with respiration noted  Abdomen: Soft, gravid, appropriate for gestational age.  Pain/Pressure: Present     Pelvic:  Cervical exam deferred        Extremities: Normal range of motion.     Mental Status: Normal mood and affect. Normal behavior. Normal judgment and thought content.   Assessment   25 y.o. [id number] at [redacted]w[redacted]d by  02/23/2021, by Ultrasound presenting for routine prenatal visit  Plan   Pregnancy 2022 Problems (from 08/06/20 to present)     Problem Noted Resolved   Abdominal pain affecting pregnancy 10/06/2020 by Rod Can, CNM No   Back pain affecting pregnancy 10/06/2020 by Rod Can, CNM No   Fever 10/06/2020 by Rod Can, CNM No   COVID-19 affecting pregnancy in second trimester 10/06/2020 by Rod Can, CNM No   Obesity in pregnancy BMI=43.9 08/13/2020 by Herbie Saxon, CNM No   Overview Signed 08/13/2020  2:41 PM by Herbie Saxon, CNM    Recommendations $RemoveBeforeDE'[ ]'purZnuyVTvivjAz$  Aspirin 81 mg daily after 12 weeks; discontinue after 36 weeks $Remove'[ ]'CSJnYmA$  Nutrition consult $RemoveBeforeDEI'[ ]'AWdmvbjkJUOoAhol$  Weight gain 11-20 lbs for singleton and 25-35 lbs for twin pregnancy (IOM guidelines) Higher class of obesity patients recommended to gain closer to lower limit  Weight loss is associated with adverse outcomes $RemoveBeforeDE'[ ]'QXjAPfzefPitApY$  Baseline and surveillance labs (pulled in from [facility], refresh links as needed)  Lab Results  Component Value Date   PLT 204 08/06/2020   CREATININE 0.68 08/06/2020   AST 12 08/06/2020   ALT 12 08/06/2020   Antenatal Testing: Not indicated.  $RemoveBef'[ ]'PSSQPWJAqy$  Growth scans every 4-6 weeks as needed (fundal height likely inadequate in morbidly obese patients)  Postpartum Care: $RemoveBeforeDE'[ ]'TWFenVzREhmLXxX$  Consider prophylactic wound vac/PICO for C/S $Remov'[ ]'DcsHbA$  Lovenox for DVT/PE prophylaxis (6 hours after vaginal delivery, 12 hours after C/S).   Lovenox 40 mg [place] q24h (BMI 30.0-39.9 kg/m2)  Lovenox 0.5 mg/kg [place] q12h ((BMI ?40 kg/m2 ); Max 150 mg [place] q12h.  Consider prolonged therapy x 6 weeks PP in very concerning patients (I.e morbid obesity with other co-morbidities that increase risk of DVT/PE) $RemoveBe'[ ]'fskSXsvsB$  Counsel about  diet, exercise and weight loss. Referrals PRN.  ICD10 Codes: O99.210   Obesity in pregnancy (BMI 30.0-39.9 kg/m2)  O99.210, E66.01 Maternal Morbid Obesity (BMI ?40 kg/m2 ).**Have to use both codes, this is a [place] code and risk adjusts/more reimbursement**  Obesity is defined as body mass index (BMI) ?30 kg/m2 .  Class I (BMI 30.0 to 34.9 kg/m2) Class II (BMI 35.0 to 39.9 kg/m2) Class III/Morbid obesity (BMI ?40 kg/       DM (diabetes mellitus), gestational 08/10/20 @ 11 6/7 wks 08/11/2020 by Junious Dresser, FNP No   Overview Addendum 08/19/2020  1:31 PM by Herbie Saxon, CNM    Needs transfer of care to [facility] for prenatal care Current Diabetic Medications:  None   '[ ]'$ ordered meter, strips, lancets- Date:   Diabetic Teaching for A1GDM or A2GDM: $RemoveB'[ ]'uuZkATYz$  Lifestyles at [facility]  [x ] [facility] DM clinic=08/31/20 $RemoveBeforeDEI'[ ]'SQIHvQEviGcRCYGn$  WIC MNT   Baseline and surveillance labs (pulled in from [facility], refresh links as needed)  Lab Results  Component Value Date   CREATININE 0.68 08/06/2020   AST 12 08/06/2020   ALT 12 08/06/2020   TSH 2.250 08/06/2020   Lab Results  Component Value Date   HGBA1C 5.2 08/06/2020    Antenatal Testing Class of DM U/S NST/AFI DELIVERY  Diabetes   A1 - good control - O24.410    A2 - good control - O24.419      A2  - poor control or poor compliance - O24.419, E11.65   (Macrosomia or polyhydramnios) **E11.65 is extra code for poor control**      20-36  20-36  20-24-28-32-36     40  32//2 x wk  32//2 x wk     40  39  PRN      '[ ]'$  referred for XX week [place] on - (insert date) $RemoveBefor'[ ]'lkoHywNXvAvG$  Results reviewed and EFW=   Postpartum:  $RemoveBefo'[ ]'AISuTlRetjs$  2 hr GTT needed $RemoveBe'[ ]'tSMASxgRG$  Counsel patient on increased lifetime risk of diabetes and recommend regular DM screeing      Abnormal glucose affecting pregnancy 08/09/2020 by Jenetta Downer, RN No   Overview Addendum 08/19/2020  1:31 PM by Herbie Saxon, CNM    [ x ] Needs 3 hour gtt.=08/10/20=  diabetic      Pregnancy, supervision for,  high-risk, unspecified trimester 08/06/2020 by Junious Dresser, FNP No   Overview Addendum 09/21/2020  4:04 PM by Adalberto Cole, RN     Nursing Staff Provider  Office Location  [facility] Dating  07/02/20- est gestational age [redacted] weeks 2 days  [facility] 02/23/2021. Small to moderate subchorionic hemorrhage.  Language  English Anatomy US    Flu Vaccine  Declined, 08/06/20 Genetic Screen  NIPS:   AFP:    First Screen:   Quad: 09/03/20 = Negative    TDaP vaccine    Hgb A1C or  GTT Early, 08/06/20 = 161 (elevated 3hr gtt) Third trimester   COVID vaccine Declined, 08/06/20    Rhogam     LAB RESULTS   Feeding Plan Breast Blood Type   B positive     (08/06/20)  Contraception BTL- booklet given 08/06/20 Antibody  Negative        (08/06/20)  Circumcision  Rubella  MMR x 2, 07/25/2020  [place] RPR   Non-reactive   (08/06/20)  Support Person  HBsAg   Negative       (08/06/20)  Prenatal Classes  HIV  Non-reactive   (08/06/20)  $RemoveBe'@28wk'NKhpoiCCL$ - Doula referral?  Varicella Varicella x 2, 04/12/2011    HCV  Negative         (08/06/20)  BTL Consent  GBS  (For PCN allergy, check sensitivities)        VBAC Consent  Pap  due 12/2020 - do postpartum     Hgb Electro    BP Cuff ordered  CF   Delivery Group  [facility]   Centering Group  OBCM involved         [redacted] weeks gestation of pregnancy 10/06/2020 by Rod Can, CNM 11/26/2020 by Gae Dry, MD   Diarrhea during pregnancy 10/06/2020 by Rod Can, [place] 11/26/2020 by Gae Dry, MD        Preterm labor symptoms and general obstetric precautions including but not limited to vaginal bleeding, contractions, leaking of fluid and fetal movement were reviewed in detail with the patient. Please refer to After Visit Summary for other counseling recommendations.   Return in about 2 weeks (around 01/11/2021) for ROB.   Prentice Docker, MD, [facility], [facility] 12/28/2020 4:38 PM

## 2020-12-28 NOTE — Procedures (Signed)
Kristen Little 02-20-1995 [redacted]w[redacted]d  Fetus A Non-Stress Test Interpretation for 12/28/20  Indication: Gestational Diabetes medication controlled, Elevated BMI Started @ 1508 Completed 1527  Fetal Heart Rate A Mode: External Baseline Rate (A): 150 bpm Variability: Moderate Accelerations: 15 x 15 Decelerations: None Multiple birth?: No  Uterine Activity Mode: Toco  Interpretation (Fetal Testing) Nonstress Test Interpretation: Reactive (Per Dr. Grace Bushy)

## 2021-01-10 ENCOUNTER — Observation Stay
Admission: EM | Admit: 2021-01-10 | Discharge: 2021-01-10 | Disposition: A | Discharge status: Home / Self Care | Payer: Medicaid Other | Attending: Obstetrics and Gynecology | Admitting: Obstetrics and Gynecology

## 2021-01-10 ENCOUNTER — Other Ambulatory Visit: Payer: Self-pay

## 2021-01-10 ENCOUNTER — Encounter: Payer: Self-pay | Admitting: Obstetrics and Gynecology

## 2021-01-10 DIAGNOSIS — O26899 Other specified pregnancy related conditions, unspecified trimester: Secondary | ICD-10-CM

## 2021-01-10 DIAGNOSIS — Z7982 Long term (current) use of aspirin: Secondary | ICD-10-CM | POA: Insufficient documentation

## 2021-01-10 DIAGNOSIS — O4703 False labor before 37 completed weeks of gestation, third trimester: Principal | ICD-10-CM | POA: Insufficient documentation

## 2021-01-10 DIAGNOSIS — Z3A33 33 weeks gestation of pregnancy: Secondary | ICD-10-CM | POA: Insufficient documentation

## 2021-01-10 DIAGNOSIS — U071 COVID-19: Secondary | ICD-10-CM

## 2021-01-10 DIAGNOSIS — O99810 Abnormal glucose complicating pregnancy: Secondary | ICD-10-CM

## 2021-01-10 DIAGNOSIS — R109 Unspecified abdominal pain: Secondary | ICD-10-CM

## 2021-01-10 DIAGNOSIS — O26893 Other specified pregnancy related conditions, third trimester: Secondary | ICD-10-CM | POA: Diagnosis not present

## 2021-01-10 DIAGNOSIS — O0990 Supervision of high risk pregnancy, unspecified, unspecified trimester: Secondary | ICD-10-CM

## 2021-01-10 DIAGNOSIS — O479 False labor, unspecified: Secondary | ICD-10-CM | POA: Diagnosis present

## 2021-01-10 DIAGNOSIS — O24410 Gestational diabetes mellitus in pregnancy, diet controlled: Secondary | ICD-10-CM

## 2021-01-10 NOTE — OB Triage Note (Signed)
Patient came in for observation for contractions Q30 minutes x4 days.Patient denies leaking of fluid and denies vaginal bleeding and spotting. Vital signs stable and patient afebrile. Patient reports +FM. FHR baseline 135 with moderate variability with accelerations 15 x 15 and no decelerations. In room alone. Monitors applied.

## 2021-01-10 NOTE — Final Progress Note (Signed)
Final Progress Note  Patient ID: Kristen Little MRN: [id number] DOB/AGE: 1995/10/31 25 y.o.  Admit date: 01/10/2021 Admitting provider: Homero Fellers, MD Discharge date: 01/10/2021   Admission Diagnoses: irregular uterine contractions  Discharge Diagnoses:  Principal Problem:   Irregular uterine contractions  Reactive fetal heart tones  History of Present Illness: The patient is a 25 y.o. female [phone] at [redacted]w[redacted]d who presents for evaluation of some previous uterine contractions and pelvic pressure. Upon her arrival, she then reports that she is not contracting, but has had some pressure from time to time. She reports her baby is active; she denies any LOF or vaginal bleeding.   Past Medical History:  Diagnosis Date   Irritable bowel syndrome (IBS)    Medical history non-contributory    Ovarian cyst    Vaginal Pap smear, abnormal     Past Surgical History:  Procedure Laterality Date   ADENOIDECTOMY W/ MYRINGOTOMY Bilateral    done as a child   CHOLECYSTECTOMY  05/27/2019   TONSILLECTOMY      No current facility-administered medications on file prior to encounter.   Current Outpatient Medications on File Prior to Encounter  Medication Sig Dispense Refill   aspirin EC 81 MG tablet Take 81 mg by mouth daily. Swallow whole.     Prenatal Vit-Fe Fumarate-FA (PRENATAL MULTIVITAMIN) TABS tablet Take 1 tablet by mouth daily at 12 noon. 100 tablet 0   Accu-Chek Softclix Lancets lancets 100 each by Other route 4 (four) times daily. Use as instructed 100 each 12   acetaminophen (TYLENOL) 500 MG tablet Take 1,000 mg by mouth every 6 (six) hours as needed.     Blood Glucose Monitoring Suppl (ACCU-CHEK GUIDE ME) w/Device KIT See admin instructions.     glucose blood test strip Use as instructed to take blood sugar 4 times per day. 100 each 12    Allergies  Allergen Reactions   Azithromycin Nausea And Vomiting   Penicillins Other (See Comments)    States she had been noted to  have allergy as a child- does not know what reaction was- has avoided taking it    Social History   Socioeconomic History   Marital status: Married    Spouse name: Advika Mclelland   Number of children: 3   Years of education: 12+   Highest education level: Associate degree: occupational, Hotel manager, or vocational program  Occupational History   Occupation: Homemaker  Tobacco Use   Smoking status: Never   Smokeless tobacco: Never   Tobacco comments:    Denies secondhand smoke.  Vaping Use   Vaping Use: Never used  Substance and Sexual Activity   Alcohol use: No   Drug use: No   Sexual activity: Yes    Birth control/protection: None  Other Topics Concern   Not on file  Social History Narrative   Not on file   Social Determinants of Health   Financial Resource Strain: Low Risk    Difficulty of Paying Living Expenses: Not very hard  Food Insecurity: No Food Insecurity   Worried About Charity fundraiser in the Last Year: Never true   Ran Out of Food in the Last Year: Never true  Transportation Needs: No Transportation Needs   Lack of Transportation (Medical): No   Lack of Transportation (Non-Medical): No  Physical Activity: Not on file  Stress: Not on file  Social Connections: Not on file  Intimate Partner Violence: Not At Risk   Fear of Current or Ex-Partner: No  Emotionally Abused: No   Physically Abused: No   Sexually Abused: No    Family History  Problem Relation Age of Onset   Heart disease Maternal Grandmother    Cancer Maternal Grandmother    Diabetes Maternal Grandmother    Cancer Maternal Grandfather    Hepatitis C Paternal Grandmother      ROS   Physical Exam: BP 112/69 (BP Location: Right Arm)   Pulse 93   Temp 98.7 F (37.1 C) (Oral)   Resp 18   LMP 05/06/2020 (Exact Date)   SpO2 100%   OBGyn Exam  Consults: None  Significant Findings/ Diagnostic Studies: BP 112/69 (BP Location: Right Arm)   Pulse 93   Temp 98.7 F (37.1 C) (Oral)    Resp 18   LMP 05/06/2020 (Exact Date)   SpO2 100%    Procedures: EFM NST Baseline FHR: 130 beats/min Variability: moderate Accelerations: present Decelerations: absent Tocometry: no contractions seen or palpated  SVE: acm/thick/ballotable  Interpretation:  INDICATIONS: patient reassurance and rule out uterine contractions RESULTS:  A NST procedure was performed with FHR monitoring and a normal baseline established, appropriate time of 20-40 minutes of evaluation, and accels >2 seen w 15x15 characteristics.  Results show a REACTIVE NST.    Hospital Course: The patient was admitted to Labor and Delivery Triage for observation. She was monitored for contractions and an NST was performed. Her FHTS were reactive and Category 1. No contractions were seen or palpated, and a cervical exam showed her to be barely dilated. She is reassured by these findings, and is discharged home.  Discharge Condition: good  Disposition: Discharge disposition: 01-Home or Self Care       Diet: Diabetic diet  Discharge Activity: Activity as tolerated  Discharge Instructions     Notify physician for a general feeling that "something is not right"   Complete by: As directed    Notify physician for increase or change in vaginal discharge   Complete by: As directed    Notify physician for intestinal cramps, with or without diarrhea, sometimes described as "gas pain"   Complete by: As directed    Notify physician for leaking of fluid   Complete by: As directed    Notify physician for low, dull backache, unrelieved by heat or Tylenol   Complete by: As directed    Notify physician for menstrual like cramps   Complete by: As directed    Notify physician for pelvic pressure   Complete by: As directed    Notify physician for uterine contractions.  These may be painless and feel like the uterus is tightening or the baby is  "balling up"   Complete by: As directed    Notify physician for vaginal  bleeding   Complete by: As directed    PRETERM LABOR:  Includes any of the follwing symptoms that occur between 20 - [redacted] weeks gestation.  If these symptoms are not stopped, preterm labor can result in preterm delivery, placing your baby at risk   Complete by: As directed       Allergies as of 01/10/2021       Reactions   Azithromycin Nausea And Vomiting   Penicillins Other (See Comments)   States she had been noted to have allergy as a child- does not know what reaction was- has avoided taking it        Medication List     TAKE these medications    Accu-Chek Guide Me w/Device Kit See admin instructions.  Accu-Chek Softclix Lancets lancets 100 each by Other route 4 (four) times daily. Use as instructed   acetaminophen 500 MG tablet Commonly known as: TYLENOL Take 1,000 mg by mouth every 6 (six) hours as needed.   aspirin EC 81 MG tablet Take 81 mg by mouth daily. Swallow whole.   glucose blood test strip Use as instructed to take blood sugar 4 times per day.   prenatal multivitamin Tabs tablet Take 1 tablet by mouth daily at 12 noon.         Total time spent taking care of this patient: 25 minutes  Signed: Imagene Riches, CNM  01/10/2021, 8:58 PM

## 2021-01-10 NOTE — Discharge Summary (Signed)
   Please see Final Progress Note.  Mirna Mires, CNM  01/10/2021 8:58 PM

## 2021-01-11 ENCOUNTER — Encounter: Payer: Self-pay | Admitting: Obstetrics & Gynecology

## 2021-01-11 ENCOUNTER — Ambulatory Visit (INDEPENDENT_AMBULATORY_CARE_PROVIDER_SITE_OTHER): Payer: Medicaid Other | Admitting: Obstetrics & Gynecology

## 2021-01-11 ENCOUNTER — Encounter: Payer: Medicaid Other | Admitting: Obstetrics & Gynecology

## 2021-01-11 DIAGNOSIS — U071 COVID-19: Secondary | ICD-10-CM

## 2021-01-11 DIAGNOSIS — O0990 Supervision of high risk pregnancy, unspecified, unspecified trimester: Secondary | ICD-10-CM

## 2021-01-11 DIAGNOSIS — M549 Dorsalgia, unspecified: Secondary | ICD-10-CM

## 2021-01-11 DIAGNOSIS — O24410 Gestational diabetes mellitus in pregnancy, diet controlled: Secondary | ICD-10-CM

## 2021-01-11 DIAGNOSIS — O99891 Other specified diseases and conditions complicating pregnancy: Secondary | ICD-10-CM

## 2021-01-11 DIAGNOSIS — R109 Unspecified abdominal pain: Secondary | ICD-10-CM

## 2021-01-11 DIAGNOSIS — Z3A33 33 weeks gestation of pregnancy: Secondary | ICD-10-CM

## 2021-01-11 DIAGNOSIS — O98513 Other viral diseases complicating pregnancy, third trimester: Secondary | ICD-10-CM | POA: Diagnosis not present

## 2021-01-11 DIAGNOSIS — R509 Fever, unspecified: Secondary | ICD-10-CM | POA: Diagnosis not present

## 2021-01-11 DIAGNOSIS — O99810 Abnormal glucose complicating pregnancy: Secondary | ICD-10-CM

## 2021-01-11 DIAGNOSIS — O99213 Obesity complicating pregnancy, third trimester: Secondary | ICD-10-CM

## 2021-01-11 NOTE — Progress Notes (Signed)
Virtual Visit via Telephone Note  I connected with patient on 01/11/21 at  4:10 PM EST by telephone and verified that I am speaking with the correct person using two identifiers.   I discussed the limitations, risks, security and privacy concerns of performing an evaluation and management service by telephone and the availability of in person appointments. I also discussed with the patient that there may be a patient responsible charge related to this service. The patient expressed understanding and agreed to proceed.  The patient was at Home (husband w flu) I spoke with the patient from my  Office  Kristen Little is a 25 y.o. G4P3003 at [redacted]w[redacted]d being seen today for ongoing prenatal care.  She is currently monitored for the following issues for this high-risk pregnancy and has Abnormal uterine bleeding; Migraines; Pregnancy, supervision for, high-risk, unspecified trimester; Abnormal glucose affecting pregnancy; DM (diabetes mellitus), gestational 08/10/20 @ 11 6/7 wks; UTI (urinary tract infection) during pregnancy 08/06/20 50-100,000 Enterococcus faecalis; Obesity in pregnancy BMI=43.9; Abdominal pain affecting pregnancy; Back pain affecting pregnancy; Fever; COVID-19 affecting pregnancy in second trimester; and Irregular uterine contractions on their problem list.  ----------------------------------------------------------------------------------- Patient reports  normal blood sugars most of the time .   Denies pain, VB, leaking of fluid.  She was seen in L&D last night for pain, was found not having PTL nor infection at that evaluation. ----------------------------------------------------------------------------------- The following portions of the patient's history were reviewed and updated as appropriate: allergies, current medications, past family history, past medical history, past social history, past surgical history and problem list. Problem list updated.   Objective  Last menstrual period  05/06/2020. Pregravid weight 256 lb (116.1 kg) Total Weight Gain -5 lb (-2.268 kg)  Physical Exam could not be performed. Because of the COVID-19 outbreak this visit was performed over the phone and not in person.   Assessment   25 y.o. G4P3003 at [redacted]w[redacted]d by  02/23/2021, by Ultrasound presenting for routine prenatal visit  Plan      Nursing Staff Provider  Office Location  [facility] Dating  07/02/20- est gestational age [redacted] weeks 2 days  [facility] 02/23/2021. Small to moderate subchorionic hemorrhage.  Language  English Anatomy US    Flu Vaccine  Declined, 08/06/20 Genetic Screen  NIPS:   AFP:    First Screen:   Quad: 09/03/20 = Negative    TDaP vaccine    Hgb A1C or  GTT Early, 08/06/20 = 161 (elevated 3hr gtt) Third trimester   COVID vaccine Declined, 08/06/20    Rhogam     LAB RESULTS   Feeding Plan Breast Blood Type   B positive     (08/06/20)  Contraception BTL- booklet given 08/06/20 11/29- desires IUD Antibody  Negative        (08/06/20)  Circumcision  Rubella  MMR x 2, 07/25/2020  Pediatrician  [place] RPR   Non-reactive   (08/06/20)  Support Person  HBsAg   Negative       (08/06/20)  Prenatal Classes  HIV  Non-reactive   (08/06/20)  @28wk- Doula referral?  Varicella Varicella x 2, 04/12/2011    HCV  Negative         (08/06/20)  BTL Consent no GBS  (For PCN allergy, check sensitivities)        VBAC Consent N/a Pap  due 12/2020 - do postpartum        BP Cuff ordered     Delivery Group  [facility]           Gestational age appropriate obstetric precautions including but not limited to vaginal bleeding, contractions, leaking of fluid and fetal movement were reviewed in detail with the patient.    Follow Up Instructions: 2 weeks HROB US for growth 2 weeks as scheduled w MFM Discussed contraception. No longer desires BTL. Prefers IUD option.   I discussed the assessment and treatment plan with the patient. The patient was provided an opportunity to ask questions and all were answered. The  patient agreed with the plan and demonstrated an understanding of the instructions.   The patient was advised to call back or seek an in-person evaluation if the symptoms worsen or if the condition fails to improve as anticipated.  I provided 15 minutes of non-face-to-face time during this encounter.  Return in 15 days (on 01/26/2021) for HROB, also 12/21 and 12/28 HROB.  Paul Harris, MD [facility], [facility] 01/11/2021 4:19 PM   

## 2021-01-17 ENCOUNTER — Other Ambulatory Visit: Payer: Self-pay

## 2021-01-17 ENCOUNTER — Other Ambulatory Visit
Admission: RE | Admit: 2021-01-17 | Discharge: 2021-01-17 | Disposition: A | Discharge status: Home / Self Care | Payer: Medicaid Other | Source: Ambulatory Visit | Attending: Anesthesiology | Admitting: Anesthesiology

## 2021-01-17 ENCOUNTER — Other Ambulatory Visit: Payer: Medicaid Other

## 2021-01-17 NOTE — Consult Note (Signed)
[facility] Anesthesia Consultation  MARGIT BATTE NLG:[id number] DOB: 02-20-1995 DOA: 01/17/2021 PCP: [place], [place]   Requesting physician: Kenton Kingfisher Date of consultation: 01/17/21 Reason for consultation: Obesity during pregnancy  CHIEF COMPLAINT:  Obesity during pregnancy  HISTORY OF PRESENT ILLNESS: Kristen Little  is a 25 y.o. female with a known history of obesity, migraine, gestational diabetes, reflux with pregnancy.  PAST MEDICAL HISTORY:   Past Medical History:  Diagnosis Date   Irritable bowel syndrome (IBS)    Medical history non-contributory    Ovarian cyst    Vaginal Pap smear, abnormal     PAST SURGICAL HISTORY:  Past Surgical History:  Procedure Laterality Date   ADENOIDECTOMY W/ MYRINGOTOMY Bilateral    done as a child   CHOLECYSTECTOMY  05/27/2019   TONSILLECTOMY      SOCIAL HISTORY:  Social History   Tobacco Use   Smoking status: Never   Smokeless tobacco: Never   Tobacco comments:    Denies secondhand smoke.  Substance Use Topics   Alcohol use: No    FAMILY HISTORY:  Family History  Problem Relation Age of Onset   Heart disease Maternal Grandmother    Cancer Maternal Grandmother    Diabetes Maternal Grandmother    Cancer Maternal Grandfather    Hepatitis C Paternal Grandmother     DRUG ALLERGIES:  Allergies  Allergen Reactions   Azithromycin Nausea And Vomiting   Penicillins Other (See Comments)    States she had been noted to have allergy as a child- does not know what reaction was- has avoided taking it    REVIEW OF SYSTEMS:   RESPIRATORY: No cough, shortness of breath, wheezing.  CARDIOVASCULAR: No chest pain, orthopnea, edema.  HEMATOLOGY: No anemia, easy bruising or bleeding SKIN: No rash or lesion. NEUROLOGIC: No tingling, numbness, weakness.  PSYCHIATRY: No anxiety or depression.   MEDICATIONS AT HOME:  Prior to Admission medications   Medication Sig Start Date End Date Taking?  Authorizing Provider  Accu-Chek Softclix Lancets lancets 100 each by Other route 4 (four) times daily. Use as instructed 10/01/20   Junious Dresser, FNP  acetaminophen (TYLENOL) 500 MG tablet Take 1,000 mg by mouth every 6 (six) hours as needed.    [provider]  aspirin EC 81 MG tablet Take 81 mg by mouth daily. Swallow whole.    [provider]  Blood Glucose Monitoring Suppl (ACCU-CHEK GUIDE ME) w/Device KIT See admin instructions. 08/31/20   [provider]  glucose blood test strip Use as instructed to take blood sugar 4 times per day. 09/21/20   Junious Dresser, FNP  Prenatal Vit-Fe Fumarate-FA (PRENATAL MULTIVITAMIN) TABS tablet Take 1 tablet by mouth daily at 12 noon. 10/01/20   Junious Dresser, FNP      PHYSICAL EXAMINATION:   VITAL SIGNS: Last menstrual period 05/06/2020.  GENERAL:  25 y.o.-year-old patient no acute distress.  HEENT: Head atraumatic, normocephalic. Oropharynx and nasopharynx clear. MP III, TM distance >3 cm, normal mouth opening. LUNGS: No use of accessory muscles of respiration.   EXTREMITIES: No pedal edema, cyanosis, or clubbing.  NEUROLOGIC: normal gait PSYCHIATRIC: The patient is alert and oriented x 3.  SKIN: No obvious rash, lesion, or ulcer.    IMPRESSION AND PLAN:   Kristen Little  is a 25 y.o. female presenting with obesity during pregnancy. BMI is currently 43 at 35 4/[redacted] weeks gestation.   We discussed analgesic options during labor including epidural analgesia. Discussed that in obesity there can be  increased difficulty with epidural placement or even failure of successful epidural. We also discussed that even after successful epidural placement there is increased risk of catheter migration out of the epidural space that would require catheter replacement. Discussed use of epidural vs spinal vs GA if cesarean delivery is required. Discussed increased risk of difficult intubation during pregnancy should an emergency cesarean delivery  be required.   Patient evaluated by me and found to be appropriate for anesthetic care at [facility].  Discussed possible complications associated with obesity as mentioned above, pt understands and agrees.  All questions answered and concerns addressed.

## 2021-01-25 ENCOUNTER — Other Ambulatory Visit: Payer: Self-pay

## 2021-01-25 DIAGNOSIS — O24913 Unspecified diabetes mellitus in pregnancy, third trimester: Secondary | ICD-10-CM

## 2021-01-27 ENCOUNTER — Other Ambulatory Visit: Payer: Self-pay

## 2021-01-27 ENCOUNTER — Ambulatory Visit: Payer: Medicaid Other | Attending: Obstetrics and Gynecology

## 2021-01-27 ENCOUNTER — Other Ambulatory Visit: Payer: Self-pay | Admitting: Obstetrics and Gynecology

## 2021-01-27 DIAGNOSIS — O99213 Obesity complicating pregnancy, third trimester: Secondary | ICD-10-CM | POA: Insufficient documentation

## 2021-01-27 DIAGNOSIS — O24913 Unspecified diabetes mellitus in pregnancy, third trimester: Secondary | ICD-10-CM | POA: Diagnosis present

## 2021-01-27 DIAGNOSIS — O24410 Gestational diabetes mellitus in pregnancy, diet controlled: Secondary | ICD-10-CM | POA: Diagnosis not present

## 2021-01-27 DIAGNOSIS — Z3A36 36 weeks gestation of pregnancy: Secondary | ICD-10-CM | POA: Insufficient documentation

## 2021-01-31 ENCOUNTER — Other Ambulatory Visit: Payer: Self-pay

## 2021-02-03 ENCOUNTER — Ambulatory Visit (HOSPITAL_BASED_OUTPATIENT_CLINIC_OR_DEPARTMENT_OTHER): Payer: Medicaid Other

## 2021-02-03 ENCOUNTER — Ambulatory Visit: Payer: Medicaid Other | Attending: Maternal & Fetal Medicine

## 2021-02-03 ENCOUNTER — Other Ambulatory Visit: Payer: Self-pay

## 2021-02-03 VITALS — BP 122/72 | HR 91 | Temp 98.0°F | Ht 64.0 in | Wt 251.0 lb

## 2021-02-03 DIAGNOSIS — O24410 Gestational diabetes mellitus in pregnancy, diet controlled: Secondary | ICD-10-CM

## 2021-02-03 DIAGNOSIS — O99810 Abnormal glucose complicating pregnancy: Secondary | ICD-10-CM

## 2021-02-03 DIAGNOSIS — O98512 Other viral diseases complicating pregnancy, second trimester: Secondary | ICD-10-CM

## 2021-02-03 DIAGNOSIS — Z3A37 37 weeks gestation of pregnancy: Secondary | ICD-10-CM | POA: Diagnosis not present

## 2021-02-03 DIAGNOSIS — O99891 Other specified diseases and conditions complicating pregnancy: Secondary | ICD-10-CM

## 2021-02-03 DIAGNOSIS — O26899 Other specified pregnancy related conditions, unspecified trimester: Secondary | ICD-10-CM

## 2021-02-03 DIAGNOSIS — M549 Dorsalgia, unspecified: Secondary | ICD-10-CM

## 2021-02-03 DIAGNOSIS — O99213 Obesity complicating pregnancy, third trimester: Secondary | ICD-10-CM | POA: Insufficient documentation

## 2021-02-03 DIAGNOSIS — R509 Fever, unspecified: Secondary | ICD-10-CM

## 2021-02-03 DIAGNOSIS — U071 COVID-19: Secondary | ICD-10-CM

## 2021-02-03 DIAGNOSIS — O0990 Supervision of high risk pregnancy, unspecified, unspecified trimester: Secondary | ICD-10-CM

## 2021-02-03 NOTE — Procedures (Signed)
Kristen Little Apr 26, 1995 [redacted]w[redacted]d  Fetus A Non-Stress Test Interpretation for 02/03/21 Started   Indication:  Elevated BMI, GDM- Diet controlled  Fetal Heart Rate A Mode: External Baseline Rate (A): 120 bpm Variability: Moderate Accelerations: 15 x 15 Decelerations: None  Uterine Activity Mode: Toco  Interpretation (Fetal Testing) Nonstress Test Interpretation: Reactive (Per Dr. Grace Bushy)

## 2021-02-04 ENCOUNTER — Encounter: Payer: Self-pay | Admitting: Obstetrics & Gynecology

## 2021-02-10 ENCOUNTER — Other Ambulatory Visit: Payer: Self-pay

## 2021-02-10 ENCOUNTER — Ambulatory Visit (INDEPENDENT_AMBULATORY_CARE_PROVIDER_SITE_OTHER): Payer: Medicaid Other | Admitting: Licensed Practical Nurse

## 2021-02-10 ENCOUNTER — Ambulatory Visit: Payer: Medicaid Other | Attending: Maternal & Fetal Medicine

## 2021-02-10 VITALS — BP 118/74 | Wt 254.0 lb

## 2021-02-10 DIAGNOSIS — Z3A38 38 weeks gestation of pregnancy: Secondary | ICD-10-CM

## 2021-02-10 DIAGNOSIS — Z113 Encounter for screening for infections with a predominantly sexual mode of transmission: Secondary | ICD-10-CM

## 2021-02-10 DIAGNOSIS — O24410 Gestational diabetes mellitus in pregnancy, diet controlled: Secondary | ICD-10-CM

## 2021-02-10 DIAGNOSIS — O0993 Supervision of high risk pregnancy, unspecified, third trimester: Secondary | ICD-10-CM

## 2021-02-10 DIAGNOSIS — Z3685 Encounter for antenatal screening for Streptococcus B: Secondary | ICD-10-CM

## 2021-02-10 DIAGNOSIS — Z1152 Encounter for screening for COVID-19: Secondary | ICD-10-CM

## 2021-02-10 NOTE — Progress Notes (Signed)
No vb. No lof.  

## 2021-02-10 NOTE — Progress Notes (Signed)
Routine Prenatal Care Visit  Subjective  Kristen Little is a 25 y.o. [phone] at [redacted]w[redacted]d being seen today for ongoing prenatal care.  She is currently monitored for the following issues for this high-risk pregnancy and has Abnormal uterine bleeding; Migraines; Pregnancy, supervision for, high-risk, unspecified trimester; Abnormal glucose affecting pregnancy; DM (diabetes mellitus), gestational 08/10/20 @ 11 6/7 wks; UTI (urinary tract infection) during pregnancy 08/06/20 50-100,000 Enterococcus faecalis; Obesity in pregnancy BMI=43.9; Abdominal pain affecting pregnancy; Back pain affecting pregnancy; Fever; COVID-19 affecting pregnancy in second trimester; and Irregular uterine contractions on their problem list.  ----------------------------------------------------------------------------------- Patient reports no complaints.  Had RNST at MFM.  Anticipatory guidance given for induction. Admits to being nervous as she has never needed an induction plus she will rely on her grandmother to care for her children while she is in the hospital.  Did not bring glucose log but reports fasting's are "in the 80's" and 2 hours after meals are "90's to low 100's".  Discussed interval tubal if a tubal is what she desires. Today she is apprehensive about an interval tubal d/t childcare.    . Vag. Bleeding: None.   . Leaking Fluid denies.  ----------------------------------------------------------------------------------- The following portions of the patient's history were reviewed and updated as appropriate: allergies, current medications, past family history, past medical history, past social history, past surgical history and problem list. Problem list updated.  Objective  Blood pressure 118/74, weight 254 lb (115.2 kg), last menstrual period 05/06/2020. Pregravid weight 256 lb (116.1 kg) Total Weight Gain -2 lb (-0.907 kg) Urinalysis: Urine Protein    Urine Glucose    Fetal Status:         RNST at MFM, active  fetus   General:  Alert, oriented and cooperative. Patient is in no acute distress.  Skin: Skin is warm and dry. No rash noted.   Cardiovascular: Normal heart rate noted  Respiratory: Normal respiratory effort, no problems with respiration noted  Abdomen: Soft, gravid, appropriate for gestational age. Pain/Pressure: Absent     Pelvic:  Cervical exam performed      1.5/50/-3  Extremities: Normal range of motion.     Mental Status: Normal mood and affect. Normal behavior. Normal judgment and thought content.   Assessment   25 y.o. [id number] at [redacted]w[redacted]d by  02/23/2021, by Ultrasound presenting for routine prenatal visit  Plan   Pregnancy 2022 Problems (from 08/06/20 to present)     Problem Noted Resolved   Abdominal pain affecting pregnancy 10/06/2020 by Rod Can, CNM No   Back pain affecting pregnancy 10/06/2020 by Rod Can, CNM No   Fever 10/06/2020 by Rod Can, CNM No   COVID-19 affecting pregnancy in second trimester 10/06/2020 by Rod Can, CNM No   Obesity in pregnancy BMI=43.9 08/13/2020 by Herbie Saxon, CNM No   Overview Signed 08/13/2020  2:41 PM by Herbie Saxon, CNM    Recommendations $RemoveBeforeDE'[ ]'SCXeyxPFQmUfDtS$  Aspirin 81 mg daily after 12 weeks; discontinue after 36 weeks $Remove'[ ]'UWlzAWD$  Nutrition consult $RemoveBeforeDEI'[ ]'XyVNhnjvzAKwudqf$  Weight gain 11-20 lbs for singleton and 25-35 lbs for twin pregnancy (IOM guidelines) Higher class of obesity patients recommended to gain closer to lower limit  Weight loss is associated with adverse outcomes $RemoveBeforeDE'[ ]'wcORFtyfJqGlmua$  Baseline and surveillance labs (pulled in from [facility], refresh links as needed)  Lab Results  Component Value Date   PLT 204 08/06/2020   CREATININE 0.68 08/06/2020   AST 12 08/06/2020   ALT 12 08/06/2020    Antenatal Testing: Not indicated.  $RemoveBef'[ ]'RbRdYKHfZf$   Growth scans every 4-6 weeks as needed (fundal height likely inadequate in morbidly obese patients)  Postpartum Care: $RemoveBeforeDE'[ ]'jIDHTKphJyrkddi$  Consider prophylactic wound vac/PICO for C/S $Remov'[ ]'WxnqFR$  Lovenox for DVT/PE prophylaxis (6 hours after  vaginal delivery, 12 hours after C/S).   Lovenox 40 mg [place] q24h (BMI 30.0-39.9 kg/m2)  Lovenox 0.5 mg/kg [place] q12h ((BMI ?40 kg/m2 ); Max 150 mg [place] q12h.  Consider prolonged therapy x 6 weeks PP in very concerning patients (I.e morbid obesity with other co-morbidities that increase risk of DVT/PE) $RemoveBe'[ ]'QTxtwvpBh$  Counsel about diet, exercise and weight loss. Referrals PRN.  ICD10 Codes: O99.210   Obesity in pregnancy (BMI 30.0-39.9 kg/m2)  O99.210, E66.01 Maternal Morbid Obesity (BMI ?40 kg/m2 ).**Have to use both codes, this is a [place] code and risk adjusts/more reimbursement**  Obesity is defined as body mass index (BMI) ?30 kg/m2 .  Class I (BMI 30.0 to 34.9 kg/m2) Class II (BMI 35.0 to 39.9 kg/m2) Class III/Morbid obesity (BMI ?40 kg/       DM (diabetes mellitus), gestational 08/10/20 @ 11 6/7 wks 08/11/2020 by Junious Dresser, FNP No   Overview Addendum 08/19/2020  1:31 PM by Herbie Saxon, CNM    Needs transfer of care to [facility] for prenatal care Current Diabetic Medications:  None   '[ ]'$ ordered meter, strips, lancets- Date:   Diabetic Teaching for A1GDM or A2GDM: $RemoveB'[ ]'CkdPPIeo$  Lifestyles at [facility]  [x ] [facility] DM clinic=08/31/20 $RemoveBeforeDEI'[ ]'BuupJBneZwQxtjIU$  WIC MNT   Baseline and surveillance labs (pulled in from [facility], refresh links as needed)  Lab Results  Component Value Date   CREATININE 0.68 08/06/2020   AST 12 08/06/2020   ALT 12 08/06/2020   TSH 2.250 08/06/2020   Lab Results  Component Value Date   HGBA1C 5.2 08/06/2020    Antenatal Testing Class of DM U/S NST/AFI DELIVERY  Diabetes   A1 - good control - O24.410    A2 - good control - O24.419      A2  - poor control or poor compliance - O24.419, E11.65   (Macrosomia or polyhydramnios) **E11.65 is extra code for poor control**      20-36  20-36  20-24-28-32-36     40  32//2 x wk  32//2 x wk     40  39  PRN      '[ ]'$  referred for XX week [place] on - (insert date) $RemoveBefor'[ ]'SWPFpcxqpMeq$  Results reviewed and EFW=   Postpartum:  $RemoveBefo'[ ]'ZGreOcKdFcq$  2 hr GTT needed $RemoveBe'[ ]'STomUEmyE$   Counsel patient on increased lifetime risk of diabetes and recommend regular DM screeing      Abnormal glucose affecting pregnancy 08/09/2020 by Jenetta Downer, RN No   Overview Addendum 08/19/2020  1:31 PM by Herbie Saxon, CNM    [ x ] Needs 3 hour gtt.=08/10/20=  diabetic      Pregnancy, supervision for, high-risk, unspecified trimester 08/06/2020 by Junious Dresser, FNP No   Overview Addendum 09/21/2020  4:04 PM by Adalberto Cole, RN     Nursing Staff Provider  Office Location  [facility] Dating  07/02/20- est gestational age [redacted] weeks 2 days  [facility] 02/23/2021. Small to moderate subchorionic hemorrhage.  Language  English Anatomy US    Flu Vaccine  Declined, 08/06/20 Genetic Screen  NIPS:   AFP:    First Screen:   Quad: 09/03/20 = Negative    TDaP vaccine    Hgb A1C or  GTT Early, 08/06/20 = 161 (elevated 3hr gtt) Third trimester   COVID vaccine  Declined, 08/06/20    Rhogam     LAB RESULTS   Feeding Plan Breast Blood Type   B positive     (08/06/20)  Contraception BTL- booklet given 08/06/20 Antibody  Negative        (08/06/20)  Circumcision  Rubella  MMR x 2, 07/25/2020  Coos RPR   Non-reactive   (08/06/20)  Support Person  HBsAg   Negative       (08/06/20)  Prenatal Classes  HIV  Non-reactive   (08/06/20)  $RemoveBe'@28wk'rafiLpuAR$ - Doula referral?  Varicella Varicella x 2, 04/12/2011    HCV  Negative         (08/06/20)  BTL Consent  GBS  (For PCN allergy, check sensitivities)        VBAC Consent  Pap  due 12/2020 - do postpartum     Hgb Electro    BP Cuff ordered  CF   Delivery Group  [facility] SMA   Centering Group  OBCM involved          [redacted] weeks gestation of pregnancy 10/06/2020 by Rod Can, CNM 11/26/2020 by Gae Dry, MD   Diarrhea during pregnancy 10/06/2020 by Rod Can, [place] 11/26/2020 by Gae Dry, MD        Term labor symptoms and general obstetric precautions including but not limited to vaginal bleeding, contractions, leaking of fluid and fetal  movement were reviewed in detail with the patient. Please refer to After Visit Summary for other counseling recommendations.   -NST in 1 week -IOL on January 6 at [place] testing ordered  -GBS and gc/ct collected  -No follow-ups on file.  Roberto Scales, [place], [facility]  02/10/21  4:13 PM

## 2021-02-10 NOTE — Procedures (Addendum)
MONIC ENGELMANN 1995-03-30 [redacted]w[redacted]d  Fetus A Non-Stress Test Interpretation for 02/10/21  Indication:  Gestational Diabetes, Obesity in Pregnancy Started @ 1411 Completed 1431  Fetal Heart Rate A Mode: External Baseline Rate (A): 130 bpm Variability: Moderate Accelerations: 15 x 15 Decelerations: None Multiple birth?: No  Uterine Activity Mode: Toco  Interpretation (Fetal Testing) Nonstress Test Interpretation: Reactive (Dr. Grace Bushy Read NST)

## 2021-02-13 NOTE — L&D Delivery Note (Signed)
Date of delivery: 02/18/2021 Estimated Date of Delivery: 02/23/21 Patient's last menstrual period was 05/06/2020 (exact date). EGA: [redacted]w[redacted]d  Delivery Note At 6:05 PM a viable female was delivered via Vaginal, Spontaneous  Presentation: Left Occiput Anterior   APGAR: 9, 9;    Weight: 3580 g Placenta status: Spontaneous, Intact.   Cord: 3 vessels with the following complications: True Knot.  Cord pH: NA  Called to see patient.  Mom pushed well to deliver a viable female infant.  The head with compound left hand, followed by shoulders, which delivered without difficulty, and the rest of the body.  No nuchal cord noted.  Baby to mom's chest.  Cord clamped and cut after 3 min delay.  No cord blood obtained.  Placenta delivered spontaneously, intact, with a 3-vessel cord.  All counts correct.  Hemostasis obtained with IV pitocin and fundal massage.      Anesthesia: Epidural Episiotomy: None Lacerations: None Suture Repair:  NA Est. Blood Loss (mL): 300  Mom to postpartum.  Baby to Couplet care / Skin to Skin.  Tresea Mall, CNM 02/18/2021, 6:52 PM

## 2021-02-14 LAB — GC/CHLAMYDIA PROBE AMP
Chlamydia trachomatis, NAA: NEGATIVE
Neisseria Gonorrhoeae by PCR: NEGATIVE

## 2021-02-14 LAB — STREP GP B CULTURE+RFLX: Strep Gp B Culture+Rflx: NEGATIVE

## 2021-02-15 ENCOUNTER — Other Ambulatory Visit: Payer: Self-pay

## 2021-02-15 DIAGNOSIS — O24410 Gestational diabetes mellitus in pregnancy, diet controlled: Secondary | ICD-10-CM

## 2021-02-15 DIAGNOSIS — J45909 Unspecified asthma, uncomplicated: Secondary | ICD-10-CM

## 2021-02-15 DIAGNOSIS — O99513 Diseases of the respiratory system complicating pregnancy, third trimester: Secondary | ICD-10-CM

## 2021-02-16 ENCOUNTER — Other Ambulatory Visit
Admission: RE | Admit: 2021-02-16 | Discharge: 2021-02-16 | Disposition: A | Discharge status: Home / Self Care | Payer: Medicaid Other | Source: Ambulatory Visit | Attending: Obstetrics & Gynecology | Admitting: Obstetrics & Gynecology

## 2021-02-16 ENCOUNTER — Other Ambulatory Visit: Payer: Self-pay

## 2021-02-16 DIAGNOSIS — Z01812 Encounter for preprocedural laboratory examination: Secondary | ICD-10-CM | POA: Insufficient documentation

## 2021-02-16 DIAGNOSIS — Z20822 Contact with and (suspected) exposure to covid-19: Secondary | ICD-10-CM | POA: Diagnosis not present

## 2021-02-17 ENCOUNTER — Other Ambulatory Visit: Payer: Self-pay

## 2021-02-17 ENCOUNTER — Ambulatory Visit: Payer: Medicaid Other | Attending: Maternal & Fetal Medicine | Admitting: Maternal & Fetal Medicine

## 2021-02-17 VITALS — BP 119/78 | HR 99 | Temp 98.0°F | Ht 64.0 in | Wt 254.5 lb

## 2021-02-17 DIAGNOSIS — R509 Fever, unspecified: Secondary | ICD-10-CM

## 2021-02-17 DIAGNOSIS — O24419 Gestational diabetes mellitus in pregnancy, unspecified control: Secondary | ICD-10-CM

## 2021-02-17 DIAGNOSIS — O26899 Other specified pregnancy related conditions, unspecified trimester: Secondary | ICD-10-CM

## 2021-02-17 DIAGNOSIS — Z3A39 39 weeks gestation of pregnancy: Secondary | ICD-10-CM

## 2021-02-17 DIAGNOSIS — O0990 Supervision of high risk pregnancy, unspecified, unspecified trimester: Secondary | ICD-10-CM

## 2021-02-17 DIAGNOSIS — O99810 Abnormal glucose complicating pregnancy: Secondary | ICD-10-CM

## 2021-02-17 DIAGNOSIS — O99891 Other specified diseases and conditions complicating pregnancy: Secondary | ICD-10-CM

## 2021-02-17 DIAGNOSIS — R109 Unspecified abdominal pain: Secondary | ICD-10-CM

## 2021-02-17 DIAGNOSIS — M549 Dorsalgia, unspecified: Secondary | ICD-10-CM

## 2021-02-17 DIAGNOSIS — U071 COVID-19: Secondary | ICD-10-CM

## 2021-02-17 LAB — SARS CORONAVIRUS 2 (TAT 6-24 HRS): SARS Coronavirus 2: NEGATIVE

## 2021-02-17 NOTE — Procedures (Signed)
Kristen Little 1995-05-02 [redacted]w[redacted]d  Fetus A Non-Stress Test Interpretation for 02/17/21  Indication:  Gestational Diabetes, Obesity in Pregnancy  Started 13:29 Completed: 13:49  Fetal Heart Rate A Mode: External Baseline Rate (A): 130 bpm Variability: Moderate Accelerations: 15 x 15 Decelerations: None Multiple birth?: No  Uterine Activity Mode: Toco Contraction Frequency (min): none Resting Tone Palpated: Relaxed  Interpretation (Fetal Testing) Nonstress Test Interpretation: Reactive (Dr. Grace Bushy Read NST

## 2021-02-18 ENCOUNTER — Encounter: Payer: Self-pay | Admitting: Obstetrics and Gynecology

## 2021-02-18 ENCOUNTER — Inpatient Hospital Stay: Payer: Medicaid Other | Admitting: Certified Registered"

## 2021-02-18 ENCOUNTER — Inpatient Hospital Stay
Admission: RE | Admit: 2021-02-18 | Discharge: 2021-02-19 | DRG: 806 | Disposition: A | Discharge status: Home / Self Care | Payer: Medicaid Other | Attending: Obstetrics and Gynecology | Admitting: Obstetrics and Gynecology

## 2021-02-18 ENCOUNTER — Other Ambulatory Visit: Payer: Self-pay

## 2021-02-18 ENCOUNTER — Other Ambulatory Visit: Payer: Self-pay | Admitting: Advanced Practice Midwife

## 2021-02-18 DIAGNOSIS — O99214 Obesity complicating childbirth: Secondary | ICD-10-CM | POA: Diagnosis present

## 2021-02-18 DIAGNOSIS — R509 Fever, unspecified: Secondary | ICD-10-CM

## 2021-02-18 DIAGNOSIS — O0990 Supervision of high risk pregnancy, unspecified, unspecified trimester: Secondary | ICD-10-CM

## 2021-02-18 DIAGNOSIS — O9903 Anemia complicating the puerperium: Secondary | ICD-10-CM | POA: Diagnosis not present

## 2021-02-18 DIAGNOSIS — D62 Acute posthemorrhagic anemia: Secondary | ICD-10-CM | POA: Diagnosis not present

## 2021-02-18 DIAGNOSIS — O24410 Gestational diabetes mellitus in pregnancy, diet controlled: Secondary | ICD-10-CM

## 2021-02-18 DIAGNOSIS — Z8616 Personal history of COVID-19: Secondary | ICD-10-CM

## 2021-02-18 DIAGNOSIS — O99891 Other specified diseases and conditions complicating pregnancy: Secondary | ICD-10-CM

## 2021-02-18 DIAGNOSIS — Z3490 Encounter for supervision of normal pregnancy, unspecified, unspecified trimester: Secondary | ICD-10-CM | POA: Diagnosis present

## 2021-02-18 DIAGNOSIS — O26899 Other specified pregnancy related conditions, unspecified trimester: Principal | ICD-10-CM

## 2021-02-18 DIAGNOSIS — O24420 Gestational diabetes mellitus in childbirth, diet controlled: Principal | ICD-10-CM | POA: Diagnosis present

## 2021-02-18 DIAGNOSIS — O99810 Abnormal glucose complicating pregnancy: Secondary | ICD-10-CM

## 2021-02-18 DIAGNOSIS — O9081 Anemia of the puerperium: Secondary | ICD-10-CM | POA: Diagnosis not present

## 2021-02-18 DIAGNOSIS — Z3A39 39 weeks gestation of pregnancy: Secondary | ICD-10-CM | POA: Diagnosis not present

## 2021-02-18 DIAGNOSIS — R109 Unspecified abdominal pain: Secondary | ICD-10-CM

## 2021-02-18 DIAGNOSIS — U071 COVID-19: Secondary | ICD-10-CM

## 2021-02-18 DIAGNOSIS — M549 Dorsalgia, unspecified: Secondary | ICD-10-CM

## 2021-02-18 HISTORY — DX: Gestational diabetes mellitus in pregnancy, diet controlled: O24.410

## 2021-02-18 LAB — CBC
HCT: 38.5 % (ref 36.0–46.0)
Hemoglobin: 12.7 g/dL (ref 12.0–15.0)
MCH: 28.6 pg (ref 26.0–34.0)
MCHC: 33 g/dL (ref 30.0–36.0)
MCV: 86.7 fL (ref 80.0–100.0)
Platelets: 192 10*3/uL (ref 150–400)
RBC: 4.44 MIL/uL (ref 3.87–5.11)
RDW: 14.6 % (ref 11.5–15.5)
WBC: 8 10*3/uL (ref 4.0–10.5)
nRBC: 0 % (ref 0.0–0.2)

## 2021-02-18 LAB — COMPREHENSIVE METABOLIC PANEL
ALT: 15 U/L (ref 0–44)
AST: 17 U/L (ref 15–41)
Albumin: 3.2 g/dL — ABNORMAL LOW (ref 3.5–5.0)
Alkaline Phosphatase: 141 U/L — ABNORMAL HIGH (ref 38–126)
Anion gap: 8 (ref 5–15)
BUN: 8 mg/dL (ref 6–20)
CO2: 21 mmol/L — ABNORMAL LOW (ref 22–32)
Calcium: 8.9 mg/dL (ref 8.9–10.3)
Chloride: 105 mmol/L (ref 98–111)
Creatinine, Ser: 0.55 mg/dL (ref 0.44–1.00)
GFR, Estimated: >60 mL/min (ref >60)
Glucose, Bld: 78 mg/dL (ref 70–99)
Potassium: 3.8 mmol/L (ref 3.5–5.1)
Sodium: 134 mmol/L — ABNORMAL LOW (ref 135–145)
Total Bilirubin: 0.7 mg/dL (ref 0.3–1.2)
Total Protein: 6.7 g/dL (ref 6.5–8.1)

## 2021-02-18 LAB — GLUCOSE, CAPILLARY
Glucose-Capillary: 94 mg/dL (ref 70–99)
Glucose-Capillary: 63 mg/dL — ABNORMAL LOW (ref 70–99)

## 2021-02-18 LAB — TYPE AND SCREEN
ABO/RH(D): B POS
Antibody Screen: NEGATIVE

## 2021-02-18 MED ORDER — BENZOCAINE-MENTHOL 20-0.5 % EX AERO
1.0000 "application " | INHALATION_SPRAY | CUTANEOUS | Status: DC | PRN
  Administered 2021-02-18: 1 via TOPICAL
  Filled 2021-02-18: qty 56

## 2021-02-18 MED ORDER — FENTANYL-BUPIVACAINE-NACL 0.5-0.125-0.9 MG/250ML-% EP SOLN
EPIDURAL | Status: DC | PRN
  Administered 2021-02-18: 12 mL/h via EPIDURAL

## 2021-02-18 MED ORDER — BUPIVACAINE HCL (PF) 0.25 % IJ SOLN
INTRAMUSCULAR | Status: DC | PRN
  Administered 2021-02-18 (×2): 5 mL via EPIDURAL

## 2021-02-18 MED ORDER — DIPHENHYDRAMINE HCL 25 MG PO CAPS: 25.0000 mg | ORAL_CAPSULE | Freq: Four times a day (QID) | ORAL | Status: DC | PRN

## 2021-02-18 MED ORDER — PRENATAL MULTIVITAMIN CH
1.0000 | ORAL_TABLET | Freq: Every day | ORAL | Status: DC
  Administered 2021-02-19: 1 via ORAL
  Filled 2021-02-18: qty 1

## 2021-02-18 MED ORDER — LIDOCAINE-EPINEPHRINE (PF) 1.5 %-1:200000 IJ SOLN
INTRAMUSCULAR | Status: DC | PRN
  Administered 2021-02-18: 3 mL via EPIDURAL

## 2021-02-18 MED ORDER — ONDANSETRON HCL 4 MG/2ML IJ SOLN: 4.0000 mg | INTRAMUSCULAR | Status: DC | PRN

## 2021-02-18 MED ORDER — SIMETHICONE 80 MG PO CHEW: 80.0000 mg | CHEWABLE_TABLET | ORAL | Status: DC | PRN

## 2021-02-18 MED ORDER — OXYTOCIN-SODIUM CHLORIDE 30-0.9 UT/500ML-% IV SOLN
1.0000 m[IU]/min | INTRAVENOUS | Status: DC
  Administered 2021-02-18: 2 m[IU]/min via INTRAVENOUS

## 2021-02-18 MED ORDER — DIBUCAINE (PERIANAL) 1 % EX OINT: 1.0000 "application " | TOPICAL_OINTMENT | CUTANEOUS | Status: DC | PRN

## 2021-02-18 MED ORDER — OXYTOCIN BOLUS FROM INFUSION
333.0000 mL | Freq: Once | INTRAVENOUS | Status: AC
  Administered 2021-02-18: 333 mL via INTRAVENOUS

## 2021-02-18 MED ORDER — ONDANSETRON HCL 4 MG/2ML IJ SOLN: 4.0000 mg | Freq: Four times a day (QID) | INTRAMUSCULAR | Status: DC | PRN

## 2021-02-18 MED ORDER — TETANUS-DIPHTH-ACELL PERTUSSIS 5-2.5-18.5 LF-MCG/0.5 IM SUSY
0.5000 mL | PREFILLED_SYRINGE | Freq: Once | INTRAMUSCULAR | Status: DC
  Filled 2021-02-18: qty 0.5

## 2021-02-18 MED ORDER — ACETAMINOPHEN 325 MG PO TABS: 650.0000 mg | ORAL_TABLET | ORAL | Status: DC | PRN

## 2021-02-18 MED ORDER — TERBUTALINE SULFATE 1 MG/ML IJ SOLN: 0.2500 mg | Freq: Once | INTRAMUSCULAR | Status: DC | PRN

## 2021-02-18 MED ORDER — MISOPROSTOL 200 MCG PO TABS
ORAL_TABLET | ORAL | Status: AC
  Administered 2021-02-18: 25 ug via VAGINAL
  Filled 2021-02-18: qty 4

## 2021-02-18 MED ORDER — MISOPROSTOL 25 MCG QUARTER TABLET
25.0000 ug | ORAL_TABLET | ORAL | Status: DC | PRN
  Filled 2021-02-18: qty 1

## 2021-02-18 MED ORDER — LIDOCAINE HCL (PF) 1 % IJ SOLN
30.0000 mL | INTRAMUSCULAR | Status: DC | PRN
  Filled 2021-02-18: qty 30

## 2021-02-18 MED ORDER — IBUPROFEN 600 MG PO TABS
600.0000 mg | ORAL_TABLET | Freq: Four times a day (QID) | ORAL | Status: DC
  Administered 2021-02-18 – 2021-02-19 (×5): 600 mg via ORAL
  Filled 2021-02-18 (×5): qty 1

## 2021-02-18 MED ORDER — SENNOSIDES-DOCUSATE SODIUM 8.6-50 MG PO TABS
2.0000 | ORAL_TABLET | Freq: Every day | ORAL | Status: DC
  Administered 2021-02-19: 2 via ORAL
  Filled 2021-02-18: qty 2

## 2021-02-18 MED ORDER — LIDOCAINE HCL (PF) 1 % IJ SOLN
INTRAMUSCULAR | Status: DC | PRN
  Administered 2021-02-18: 3 mL via INTRADERMAL

## 2021-02-18 MED ORDER — OXYCODONE HCL 5 MG PO TABS
5.0000 mg | ORAL_TABLET | ORAL | Status: DC | PRN
  Administered 2021-02-19: 5 mg via ORAL
  Filled 2021-02-18: qty 1

## 2021-02-18 MED ORDER — OXYTOCIN 10 UNIT/ML IJ SOLN
INTRAMUSCULAR | Status: AC
  Filled 2021-02-18: qty 2

## 2021-02-18 MED ORDER — LACTATED RINGERS IV SOLN: 500.0000 mL | INTRAVENOUS | Status: DC | PRN

## 2021-02-18 MED ORDER — AMMONIA AROMATIC IN INHA
RESPIRATORY_TRACT | Status: AC
  Filled 2021-02-18: qty 10

## 2021-02-18 MED ORDER — LACTATED RINGERS IV BOLUS
500.0000 mL | Freq: Once | INTRAVENOUS | Status: AC
  Administered 2021-02-18: 500 mL via INTRAVENOUS

## 2021-02-18 MED ORDER — WITCH HAZEL-GLYCERIN EX PADS: 1.0000 "application " | MEDICATED_PAD | CUTANEOUS | Status: DC | PRN

## 2021-02-18 MED ORDER — OXYTOCIN-SODIUM CHLORIDE 30-0.9 UT/500ML-% IV SOLN
2.5000 [IU]/h | INTRAVENOUS | Status: DC
  Filled 2021-02-18: qty 500

## 2021-02-18 MED ORDER — FENTANYL-BUPIVACAINE-NACL 0.5-0.125-0.9 MG/250ML-% EP SOLN
EPIDURAL | Status: AC
  Filled 2021-02-18: qty 250

## 2021-02-18 MED ORDER — ACETAMINOPHEN 325 MG PO TABS
650.0000 mg | ORAL_TABLET | ORAL | Status: DC | PRN
  Administered 2021-02-18 – 2021-02-19 (×2): 650 mg via ORAL
  Filled 2021-02-18 (×2): qty 2

## 2021-02-18 MED ORDER — ONDANSETRON HCL 4 MG PO TABS: 4.0000 mg | ORAL_TABLET | ORAL | Status: DC | PRN

## 2021-02-18 MED ORDER — COCONUT OIL OIL
1.0000 "application " | TOPICAL_OIL | Status: DC | PRN
  Administered 2021-02-19: 1 via TOPICAL
  Filled 2021-02-18: qty 120

## 2021-02-18 MED ORDER — BUTORPHANOL TARTRATE 1 MG/ML IJ SOLN: 1.0000 mg | INTRAMUSCULAR | Status: DC | PRN

## 2021-02-18 MED ORDER — LACTATED RINGERS IV SOLN: INTRAVENOUS | Status: DC

## 2021-02-18 NOTE — H&P (Signed)
OB History & Physical   History of Present Illness:  Chief Complaint: elective induction of labor  HPI:  Kristen Little is a 26 y.o. [phone] female at 76w2ddated by 6 week ultrasound.  Her pregnancy has been complicated by GDMA1, obesity, covid infection second trimester .    She denies contractions.   She denies leakage of fluid.   She denies vaginal bleeding.   She reports fetal movement.    Total weight gain for pregnancy: -0.907 kg   Obstetrical Problem List: Pregnancy 2022 Problems (from 08/06/20 to present)     Problem Noted Resolved   Abdominal pain affecting pregnancy 10/06/2020 by GRod Can CNM No   Back pain affecting pregnancy 10/06/2020 by GRod Can CNM No   Fever 10/06/2020 by GRod Can CNM No   COVID-19 affecting pregnancy in second trimester 10/06/2020 by GRod Can CNM No   Obesity in pregnancy BMI=43.9 08/13/2020 by SHerbie Saxon CNM No   Overview Signed 08/13/2020  2:41 PM by SHerbie Saxon CNM    Recommendations _0  Aspirin 81 mg daily after 12 weeks; discontinue after 36 weeks _1  Nutrition consult _2  Weight gain 11-20 lbs for singleton and 25-35 lbs for twin pregnancy (IOM guidelines) Higher class of obesity patients recommended to gain closer to lower limit  Weight loss is associated with adverse outcomes _3  Baseline and surveillance labs [facility] links as needed)  Lab Results  Component Value Date   PLT 204 08/06/2020   CREATININE 0.68 08/06/2020   AST 12 08/06/2020   ALT 12 08/06/2020   Antenatal Testing: Not indicated.  _4  Growth scans every 4-6 weeks as needed (fundal height likely inadequate in morbidly obese patients)  Postpartum Care: _5  Consider prophylactic wound vac/PICO for C/S _6  Lovenox for DVT/PE prophylaxis (6 hours after vaginal delivery, 12 hours after C/S).   Lovenox 40 mg [place] q24h (BMI 30.0-39.9 kg/m2)  Lovenox 0.5 mg/kg [place] q12h ((BMI ?40 kg/m2 ); Max 150 mg [place] q12h.  Consider  prolonged therapy x 6 weeks PP in very concerning patients (I.e morbid obesity with other co-morbidities that increase risk of DVT/PE) _7  Counsel about diet, exercise and weight loss. Referrals PRN.  ICD10 Codes: O99.210   Obesity in pregnancy (BMI 30.0-39.9 kg/m2)  O99.210, E66.01 Maternal Morbid Obesity (BMI ?40 kg/m2 ).**Have to use both codes, this is a HBowiecode and risk adjusts/more reimbursement**  Obesity is defined as body mass index (BMI) ?30 kg/m2 .  Class I (BMI 30.0 to 34.9 kg/m2) Class II (BMI 35.0 to 39.9 kg/m2) Class III/Morbid obesity (BMI ?40 kg/       DM (diabetes mellitus), gestational 08/10/20 @ 11 6/7 wks 08/11/2020 by GJunious Dresser FNP No   Overview Addendum 08/19/2020  1:31 PM by SHerbie Saxon CNM    [facility] Current Diabetic Medications:  None   _8 ordered meter, strips, lancets- Date:   Diabetic Teaching for A1GDM or A2GDM: _9  [facility]=08/31/20 _10  WIC MNT   [facility] and surveillance labs [facility])  Lab Results  Component Value Date   CREATININE 0.68 08/06/2020   AST 12 08/06/2020   ALT 12 08/06/2020   TSH 2.250 08/06/2020   Lab Results  Component Value Date   HGBA1C 5.2 08/06/2020    Antenatal Testing Class of DM U/S NST/AFI DELIVERY  Diabetes   A1 - good control - O24.410    A2 - good control - O24.419      A2  - poor control or poor compliance - O24.419, E11.65   (Macrosomia or polyhydramnios) **E11.65 is extra code for poor control**      20-36  20-36  20-24-28-32-36     40  32//2 x wk  32//2 x wk     40  39  PRN      _0  referred for XX week [place] on - (insert date) _1  Results reviewed and EFW=   Postpartum:  _2  2 hr GTT needed _3  Counsel patient on increased lifetime risk of diabetes and recommend regular DM screeing      Abnormal glucose affecting pregnancy 08/09/2020 by Jenetta Downer,  RN No   Overview Addendum 08/19/2020  1:31 PM by Herbie Saxon, CNM    [ x ] Needs 3 hour gtt.=08/10/20=  diabetic      Pregnancy, supervision for, high-risk, unspecified trimester 08/06/2020 by Junious Dresser, FNP No   Overview Addendum 09/21/2020  4:04 PM by Adalberto Cole, RN     Nursing Staff Provider  Office Location  [facility] Dating  07/02/20- est gestational age [redacted] weeks 2 days  [facility] 02/23/2021. Small to moderate subchorionic hemorrhage.  Language  English Anatomy US    Flu Vaccine  Declined, 08/06/20 Genetic Screen  NIPS:   AFP:    First Screen:   Quad: 09/03/20 = Negative    TDaP vaccine    Hgb A1C or  GTT Early, 08/06/20 = 161 (elevated 3hr gtt) Third trimester   COVID vaccine Declined, 08/06/20    Rhogam     LAB RESULTS   Feeding Plan Breast Blood Type   B positive     (08/06/20)  Contraception BTL- booklet given 08/06/20 Antibody  Negative        (08/06/20)  Circumcision  Rubella  MMR x 2, 07/25/2020  [place] RPR   Non-reactive   (08/06/20)  Support Person  HBsAg   Negative       (08/06/20)  Prenatal Classes  HIV  Non-reactive   (08/06/20)  _4 - Doula referral?  Varicella Varicella x 2, 04/12/2011    HCV  Negative         (08/06/20)  BTL Consent  GBS  (For PCN allergy, check sensitivities)        VBAC Consent  Pap  due 12/2020 - do postpartum     Hgb Electro    BP Cuff ordered  CF   Delivery Group  [facility] SMA   Centering Group  OBCM involved         [redacted] weeks gestation of pregnancy 10/06/2020 by Rod Can, CNM 11/26/2020 by Gae Dry, MD   Diarrhea during pregnancy 10/06/2020 by Rod Can, CNM 11/26/2020 by Gae Dry, MD        Maternal Medical History:   Past Medical History:  Diagnosis Date   Irritable bowel syndrome (IBS)    Medical history non-contributory    Ovarian cyst    Vaginal Pap smear, abnormal     Past Surgical History:  Procedure Laterality Date   ADENOIDECTOMY W/ MYRINGOTOMY Bilateral    done as a child    CHOLECYSTECTOMY  05/27/2019   ERCP     TONSILLECTOMY      Allergies  Allergen Reactions   Azithromycin Nausea And Vomiting   Penicillins Other (See Comments)    States  she had been noted to have allergy as a child- does not know what reaction was- has avoided taking it    Prior to Admission medications   Medication Sig Start Date End Date Taking? Authorizing Provider  Accu-Chek Softclix Lancets lancets 100 each by Other route 4 (four) times daily. Use as instructed 10/01/20  Yes Junious Dresser, FNP  Blood Glucose Monitoring Suppl (ACCU-CHEK GUIDE ME) w/Device KIT See admin instructions. 08/31/20  Yes [provider]  glucose blood test strip Use as instructed to take blood sugar 4 times per day. 09/21/20  Yes Junious Dresser, FNP  Prenatal Vit-Fe Fumarate-FA (PRENATAL MULTIVITAMIN) TABS tablet Take 1 tablet by mouth daily at 12 noon. 10/01/20  Yes Junious Dresser, FNP  acetaminophen (TYLENOL) 500 MG tablet Take 1,000 mg by mouth every 6 (six) hours as needed. Patient not taking: Reported on 02/17/2021    [provider]  aspirin EC 81 MG tablet Take 81 mg by mouth daily. Swallow whole. Patient not taking: Reported on 02/17/2021    [provider]    OB History  Gravida Para Term Preterm AB Living  _0 0 0 3  SAB IAB Ectopic Multiple Live Births  0 0 0 0 3    # Outcome Date GA Lbr Len/2nd Weight Sex Delivery Anes PTL Lv  4 Current           3 Term 12/31/17 [redacted]w[redacted]d 3289 g F Vag-Spont   LIV  2 Term 06/10/15 [id number] g F Vag-Spont EPI, Local  LIV  1 Term 04/10/11   2835 g F Vag-Spont EPI N LIV    Obstetric Comments  Observed for high BP- resolved, Vaginal bleeding- query post v/e, decreased Fetal movement- resolved, right side Ovarian cyst- resolved    Prenatal care site: [facility]  Social History: She  reports that she has never smoked. She has never used smokeless tobacco. She reports that she does not drink alcohol and does not use drugs.  Family  History: family history includes Cancer in her maternal grandfather and maternal grandmother; Diabetes in her maternal grandmother; Heart disease in her maternal grandmother; Hepatitis C in her paternal grandmother.    Review of Systems:  Review of Systems  Constitutional:  Negative for chills and fever.  HENT:  Negative for congestion, ear discharge, ear pain, hearing loss, sinus pain and sore throat.   Eyes:  Negative for blurred vision and double vision.  Respiratory:  Negative for cough, shortness of breath and wheezing.   Cardiovascular:  Negative for chest pain, palpitations and leg swelling.  Gastrointestinal:  Negative for abdominal pain, blood in stool, constipation, diarrhea, heartburn, melena, nausea and vomiting.  Genitourinary:  Negative for dysuria, flank pain, frequency, hematuria and urgency.  Musculoskeletal:  Negative for back pain, joint pain and myalgias.  Skin:  Negative for itching and rash.  Neurological:  Negative for dizziness, tingling, tremors, sensory change, speech change, focal weakness, seizures, loss of consciousness, weakness and headaches.  Endo/Heme/Allergies:  Negative for environmental allergies. Does not bruise/bleed easily.  Psychiatric/Behavioral:  Negative for depression, hallucinations, memory loss, substance abuse and suicidal ideas. The patient is not nervous/anxious and does not have insomnia.     Physical Exam:  BP 138/89 (BP Location: Left Arm)    Pulse 98    Temp 98 F (36.7 C) (Oral)    Resp 18    Ht _1  (1.626 m)    Wt 115.2 kg    LMP 05/06/2020 (Exact Date)  BMI 43.60 kg/m   Constitutional: Well nourished, well developed female in no acute distress.  HEENT: normal Skin: Warm and dry.  Cardiovascular: Regular rate and rhythm.   Extremity:  no edema   Respiratory: Clear to auscultation bilateral. Normal respiratory effort Abdomen: FHT present Neuro: DTRs 2+, Cranial nerves grossly intact Psych: Alert and Oriented x3. No memory  deficits. Normal mood and affect.  MS: normal gait, normal bilateral lower extremity ROM/strength/stability.  Pelvic exam: (female chaperone present) is not limited by body habitus EGBUS: within normal limits Vagina: within normal limits and with normal mucosa Cervix: tight 3/50/-3 Cervical sweep, Cytotec placed   Baseline FHR: 130 beats/min   Variability: moderate   Accelerations: present   Decelerations: absent Contractions: absent  Overall assessment: reassuring   Lab Results  Component Value Date   Marissa NEGATIVE 02/16/2021    Assessment:  CARLEAH YABLONSKI is a 26 y.o. [id number] female at 69w2dwith elective induction of labor.   Plan:  Admit to Labor & Delivery  CBC, T&S, Clrs, IVF GBS negative.   Fetal well-being: Category I GDMA1: check blood sugars q 4 hours in first stage  S/P first dose cytotec placed vaginally   JRod Can CNM 02/18/2021 10:03 AM

## 2021-02-18 NOTE — Progress Notes (Signed)
°  Labor Progress Note   26 y.o. [id number] @ [redacted]w[redacted]d , admitted for  Pregnancy, Labor Management.   Subjective:  Comfortable with epidural  Objective:  BP (!) 115/54 (BP Location: Right Arm)    Pulse 96    Temp 98.7 F (37.1 C) (Oral)    Resp 18    Ht 5\' 4"  (1.626 m)    Wt 115.2 kg    LMP 05/06/2020 (Exact Date)    SpO2 98%    BMI 43.60 kg/m  Abd: gravid, ND, FHT present, mild tenderness on exam Extr: no edema SVE: CERVIX: 5 cm dilated, 70 effaced, -2 station AROM clear, large amount  EFM: FHR: 125 bpm, variability: moderate,  accelerations:  Present,  decelerations:  Absent Toco: Frequency: Every 2-3 minutes Labs: I have reviewed the patient's lab results.   Assessment & Plan:  05/08/2020 @ [redacted]w[redacted]d, admitted for  Pregnancy and Labor/Delivery Management  1. Pain management: epidural. 2. FWB: FHT category I.  3. ID: GBS negative 4. Labor management: s/p AROM, continue pitocin titration  All discussed with patient, see orders   [redacted]w[redacted]d, CNM [facility] 02/18/2021  4:26 PM

## 2021-02-18 NOTE — Anesthesia Procedure Notes (Signed)
Epidural Patient location during procedure: OB  Staffing Anesthesiologist: Piscitello, Precious Haws, MD Resident/CRNA: Rolla Plate, CRNA Performed: resident/CRNA   Preanesthetic Checklist Completed: patient identified, IV checked, site marked, risks and benefits discussed, surgical consent, monitors and equipment checked, pre-op evaluation and timeout performed  Epidural Patient position: sitting Prep: DuraPrep and site prepped and draped Patient monitoring: heart rate, continuous pulse ox and blood pressure Approach: midline Location: L4-L5 Injection technique: LOR saline  Needle:  Needle type: Tuohy  Needle gauge: 17 G Needle length: 9 cm and 9 Needle insertion depth: 8 cm Catheter type: closed end flexible Catheter size: 19 Gauge Catheter at skin depth: 13 cm Test dose: negative and 1.5% lidocaine with Epi 1:200 K  Assessment Events: blood not aspirated, injection not painful, no injection resistance, no paresthesia and negative IV test  Additional Notes   Patient tolerated the insertion well without complications.Reason for block:procedure for pain

## 2021-02-18 NOTE — Progress Notes (Signed)
2 hr gtt future ordered for 6 week postpartum visit

## 2021-02-18 NOTE — Discharge Summary (Signed)
OB Discharge Summary     Patient Name: Kristen Little DOB: [birth year] MRN: [id number]  Date of admission: 02/18/2021 Delivering provider: Rod Can, CNM  Date of Delivery: 02/18/2021  Date of discharge: 02/19/2021  Admitting diagnosis: Encounter for elective induction of labor [Z34.90] Intrauterine pregnancy: [redacted]w[redacted]d     Secondary diagnosis: Gestational Diabetes diet controlled (A1)     Discharge diagnosis: Term Pregnancy Delivered and GDM A1                                                                                                Post partum procedures: NA  Augmentation: AROM, Pitocin, and Cytotec  Complications: None  Hospital course:  Induction of Labor With Vaginal Delivery   26 y.o. yo [phone] at [redacted]w[redacted]d was admitted to the hospital 02/18/2021 for induction of labor.  Indication for induction: Elective.  Patient had an uncomplicated labor course as follows: Membrane Rupture Time/Date: 4:21 PM ,02/18/2021   Delivery Method:Vaginal, Spontaneous  Episiotomy: None  Lacerations:  None  Details of delivery can be found in separate delivery note.    Patient had a routine postpartum course.   Patient is discharged home 02/19/21.  Newborn Data: Birth date:02/18/2021  Birth time:6:05 PM  Gender:Female  Camila Living status:Living  Apgars:9 ,9  Weight:3580 g  7 pounds 14 ounces  Physical exam  Vitals:   02/19/21 0033 02/19/21 0410 02/19/21 0729 02/19/21 1608  BP: 126/77 121/71 130/83 123/83  Pulse: 95 77 80 88  Resp: $Remo'17 18 18   'xYiSJ$ Temp: 98.3 F (36.8 C) 98 F (36.7 C) 98.1 F (36.7 C) 98.6 F (37 C)  TempSrc: Oral Oral Oral Oral  SpO2: 97% 100% 99% 96%  Weight:      Height:       General: alert and no distress Breasts: soft, nipples intact bilaterally  Lochia: appropriate Uterine Fundus: firm Incision: N/A Perineum: slightly edematous.  Extremities: no edema DVT Evaluation: No evidence of DVT seen on physical exam. Negative Homan's sign.  Labs: Lab Results   Component Value Date   WBC 11.0 (H) 02/19/2021   HGB 12.2 02/19/2021   HCT 36.5 02/19/2021   MCV 88.6 02/19/2021   PLT 183 02/19/2021    Discharge instruction: per After Visit Summary.  Medications:  Allergies as of 02/19/2021       Reactions   Azithromycin Nausea And Vomiting   Penicillins Other (See Comments)   States she had been noted to have allergy as a child- does not know what reaction was- has avoided taking it        Medication List     STOP taking these medications    Accu-Chek Guide Me w/Device Kit   Accu-Chek Softclix Lancets lancets   aspirin EC 81 MG tablet   glucose blood test strip       TAKE these medications    acetaminophen 500 MG tablet Commonly known as: TYLENOL Take 1,000 mg by mouth every 6 (six) hours as needed.   coconut oil Oil Apply 1 application topically as needed.   ibuprofen 600 MG tablet Commonly known as: ADVIL Take 1 tablet (  600 mg total) by mouth every 6 (six) hours.   prenatal multivitamin Tabs tablet Take 1 tablet by mouth daily at 12 noon.        Diet: carb modified diet  Activity: Advance as tolerated. Pelvic rest for 6 weeks.   Outpatient follow up:  Follow-up Information     Schuman, Stefanie Libel, MD. Schedule an appointment as soon as possible for a visit in 3 week(s).   Specialty: Obstetrics and Gynecology Why: tubal ligation planning visit and schedule appointment for 6 week postpartum exam and 2 hour gtt with Rod Can, CNM Contact information: [address] [place] [address] [phone]                   Postpartum contraception:  planning interval tubal ligation Rhogam Given postpartum: NA Rubella vaccine given postpartum: MMRx2 Varicella vaccine given postpartum: Varivaxx2 TDaP given antepartum or postpartum: offered postpartum    Newborn Delivery   Birth date/time: 02/18/2021 18:05:00 Delivery type: Vaginal, Spontaneous       Baby Feeding:  Breast and  Formula  Disposition:home with mother  SIGNEDJillene Bucks [facility], CNM 02/19/2021 4:45 PM

## 2021-02-18 NOTE — Anesthesia Preprocedure Evaluation (Signed)
Anesthesia Evaluation  Patient identified by MRN, date of birth, ID band Patient awake    Reviewed: Allergy & Precautions, H&P , NPO status , Patient's Chart, lab work & pertinent test results  History of Anesthesia Complications Negative for: history of anesthetic complications  Airway Mallampati: III  TM Distance: >3 FB Neck ROM: full    Dental  (+) Dental Advidsory Given   Pulmonary neg pulmonary ROS,           Cardiovascular negative cardio ROS       Neuro/Psych  Headaches, PSYCHIATRIC DISORDERS    GI/Hepatic Neg liver ROS, GERD  ,  Endo/Other  diabetes  Renal/GU negative Renal ROS  negative genitourinary   Musculoskeletal   Abdominal   Peds  Hematology negative hematology ROS (+)   Anesthesia Other Findings   Reproductive/Obstetrics (+) Pregnancy                             Anesthesia Physical Anesthesia Plan  ASA: 2  Anesthesia Plan: Epidural   Post-op Pain Management:    Induction:   PONV Risk Score and Plan:   Airway Management Planned:   Additional Equipment:   Intra-op Plan:   Post-operative Plan:   Informed Consent: I have reviewed the patients History and Physical, chart, labs and discussed the procedure including the risks, benefits and alternatives for the proposed anesthesia with the patient or authorized representative who has indicated his/her understanding and acceptance.       Plan Discussed with: CRNA  Anesthesia Plan Comments:         Anesthesia Quick Evaluation

## 2021-02-18 NOTE — Progress Notes (Signed)
°  Labor Progress Note   26 y.o. [id number] @ [redacted]w[redacted]d , admitted for  Pregnancy, Labor Management.   Subjective:  Starting to feel rectal pressure  Objective:  BP 101/74    Pulse 85    Temp 98.7 F (37.1 C) (Oral)    Resp 18    Ht 5\' 4"  (1.626 m)    Wt 115.2 kg    LMP 05/06/2020 (Exact Date)    SpO2 99%    BMI 43.60 kg/m  Abd: gravid, ND, FHT present, mild tenderness on exam Extr: no edema SVE: CERVIX: 8-9 cm dilated, 90 effaced, 0 station FSE and IUPC placed  EFM: FHR: 135 bpm, variability: moderate,  accelerations:  Present,  decelerations:  Present Early Toco: Frequency: Every 1-3 minutes Labs: I have reviewed the patient's lab results.   Assessment & Plan:  05/08/2020 @ [redacted]w[redacted]d, admitted for  Pregnancy and Labor/Delivery Management  1. Pain management: epidural. 2. FWB: FHT category II and overall reassuring.  3. ID: GBS negative 4. Labor management: continue pitocin, expectant management for SVD  All discussed with patient, see orders   [redacted]w[redacted]d, CNM [facility] 02/18/2021  5:36 PM

## 2021-02-19 LAB — CBC
HCT: 36.5 % (ref 36.0–46.0)
Hemoglobin: 12.2 g/dL (ref 12.0–15.0)
MCH: 29.6 pg (ref 26.0–34.0)
MCHC: 33.4 g/dL (ref 30.0–36.0)
MCV: 88.6 fL (ref 80.0–100.0)
Platelets: 183 10*3/uL (ref 150–400)
RBC: 4.12 MIL/uL (ref 3.87–5.11)
RDW: 14.6 % (ref 11.5–15.5)
WBC: 11 10*3/uL — ABNORMAL HIGH (ref 4.0–10.5)
nRBC: 0 % (ref 0.0–0.2)

## 2021-02-19 LAB — RPR: RPR Ser Ql: NONREACTIVE

## 2021-02-19 MED ORDER — IBUPROFEN 600 MG PO TABS: 600.0000 mg | ORAL_TABLET | Freq: Four times a day (QID) | ORAL | 0 refills | Status: DC

## 2021-02-19 MED ORDER — COCONUT OIL OIL: 1.0000 "application " | TOPICAL_OIL | 0 refills | Status: DC | PRN

## 2021-02-19 NOTE — Progress Notes (Signed)
Discharge instructions complete and prescriptions sent to the pharmacy by the provider. Patient verbalizes understanding of teaching.  

## 2021-02-19 NOTE — Progress Notes (Signed)
Subjective:  Doing well.  Minimal pain, cramping with nursing. Ambulating and voiding without difficulty.  Independently latching infant.  Partner at the bedside.  Objective:  Vital signs in last 24 hours: Temp:  [98 F (36.7 C)-99.3 F (37.4 C)] 98.1 F (36.7 C) (01/07 0729) Pulse Rate:  [77-165] 80 (01/07 0729) Resp:  [16-18] 18 (01/07 0729) BP: (101-150)/(45-113) 130/83 (01/07 0729) SpO2:  [96 %-100 %] 99 % (01/07 0729)    General: NAD Breasts: soft, nipples intact bilaterally Pulmonary: no increased work of breathing Abdomen: non-distended, non-tender, fundus firm at level of umbilicus Perineum: slightly edematous, lochia small, rubra  Extremities: no edema, no erythema, no tenderness  Results for orders placed or performed during the hospital encounter of 02/18/21 (from the past 72 hour(s))  CBC     Status: None   Collection Time: 02/18/21  9:43 AM  Result Value Ref Range   WBC 8.0 4.0 - 10.5 K/uL   RBC 4.44 3.87 - 5.11 MIL/uL   Hemoglobin 12.7 12.0 - 15.0 g/dL   HCT 58.8 50.2 - 77.4 %   MCV 86.7 80.0 - 100.0 fL   MCH 28.6 26.0 - 34.0 pg   MCHC 33.0 30.0 - 36.0 g/dL   RDW 12.8 78.6 - 76.7 %   Platelets 192 150 - 400 K/uL   nRBC 0.0 0.0 - 0.2 %    Comment: Performed at [facility], [address] [place], [place] [address]  Type and screen [facility]     Status: None   Collection Time: 02/18/21  9:43 AM  Result Value Ref Range   ABO/RH(D) B POS    Antibody Screen NEG    Sample Expiration      02/21/2021,2359 Performed at [facility], [address] [place], [place] [address]   RPR     Status: None   Collection Time: 02/18/21  9:43 AM  Result Value Ref Range   RPR Ser Ql NON REACTIVE NON REACTIVE    Comment: Performed at [facility], [address], [place], [place] [address]  Comprehensive metabolic panel     Status: Abnormal   Collection Time: 02/18/21  9:43 AM  Result Value Ref Range   Sodium 134 (L)  135 - 145 mmol/L   Potassium 3.8 3.5 - 5.1 mmol/L   Chloride 105 98 - 111 mmol/L   CO2 21 (L) 22 - 32 mmol/L   Glucose, Bld 78 70 - 99 mg/dL    Comment: Glucose reference range applies only to samples taken after fasting for at least 8 hours.   BUN 8 6 - 20 mg/dL   Creatinine, Ser 4.76 0.44 - 1.00 mg/dL   Calcium 8.9 8.9 - 54.6 mg/dL   Total Protein 6.7 6.5 - 8.1 g/dL   Albumin 3.2 (L) 3.5 - 5.0 g/dL   AST 17 15 - 41 U/L   ALT 15 0 - 44 U/L   Alkaline Phosphatase 141 (H) 38 - 126 U/L   Total Bilirubin 0.7 0.3 - 1.2 mg/dL   GFR, Estimated >50 >35 mL/min    Comment: (NOTE) Calculated using the CKD-EPI Creatinine Equation (2021)    Anion gap 8 5 - 15    Comment: Performed at [facility], [address] [place], [place] [address]  Glucose, capillary     Status: None   Collection Time: 02/18/21  1:53 PM  Result Value Ref Range   Glucose-Capillary 94 70 - 99 mg/dL  Comment: Glucose reference range applies only to samples taken after fasting for at least 8 hours.  Glucose, capillary     Status: Abnormal   Collection Time: 02/18/21  5:58 PM  Result Value Ref Range   Glucose-Capillary 63 (L) 70 - 99 mg/dL    Comment: Glucose reference range applies only to samples taken after fasting for at least 8 hours.  CBC     Status: Abnormal   Collection Time: 02/19/21  5:32 AM  Result Value Ref Range   WBC 11.0 (H) 4.0 - 10.5 K/uL   RBC 4.12 3.87 - 5.11 MIL/uL   Hemoglobin 12.2 12.0 - 15.0 g/dL   HCT 63.0 16.0 - 10.9 %   MCV 88.6 80.0 - 100.0 fL   MCH 29.6 26.0 - 34.0 pg   MCHC 33.4 30.0 - 36.0 g/dL   RDW 32.3 55.7 - 32.2 %   Platelets 183 150 - 400 K/uL   nRBC 0.0 0.0 - 0.2 %    Comment: Performed at [facility], [address], [place], [place] [address]    Assessment:   26 y.o. [phone] postpartum day # 1  Plan:    1) Acute blood loss anemia - hemodynamically stable and asymptomatic - po ferrous sulfate  2) Blood Type --/--/B POS (01/06 0943) / Rubella    / Varicella Immune  3) TDAP status  offer at discharge   4) Feeding plan breast  5)  Education given regarding options for contraception, as well as compatibility with breast feeding if applicable.  Patient plans on tubal ligation for contraception.  6) Disposition, discharge once all 24 hour cares complete on infant   Carie Caddy, [facility], [facility] 02/19/2021, 12:03 PM

## 2021-03-01 ENCOUNTER — Telehealth: Payer: Self-pay | Admitting: Advanced Practice Midwife

## 2021-03-01 NOTE — Telephone Encounter (Signed)
PT is sch on 04-07-2021 with JEG for Paraguard placment.

## 2021-03-01 NOTE — Telephone Encounter (Signed)
Noted. Will order to arrive by apt date/time. 

## 2021-03-04 ENCOUNTER — Other Ambulatory Visit: Payer: Self-pay | Admitting: Licensed Practical Nurse

## 2021-03-07 ENCOUNTER — Other Ambulatory Visit: Payer: Self-pay | Admitting: Licensed Practical Nurse

## 2021-03-14 ENCOUNTER — Ambulatory Visit: Payer: Medicaid Other | Admitting: Obstetrics and Gynecology

## 2021-03-31 ENCOUNTER — Telehealth: Payer: Self-pay

## 2021-03-31 NOTE — Telephone Encounter (Signed)
Pt left msg on triage asking for a RF request. Called pt back to get Rx name, no answer, [place].

## 2021-04-07 ENCOUNTER — Other Ambulatory Visit: Payer: Self-pay

## 2021-04-07 ENCOUNTER — Encounter: Payer: Self-pay | Admitting: Advanced Practice Midwife

## 2021-04-07 ENCOUNTER — Ambulatory Visit (INDEPENDENT_AMBULATORY_CARE_PROVIDER_SITE_OTHER): Payer: Medicaid Other | Admitting: Advanced Practice Midwife

## 2021-04-07 ENCOUNTER — Other Ambulatory Visit: Payer: Medicaid Other

## 2021-04-07 ENCOUNTER — Other Ambulatory Visit (HOSPITAL_COMMUNITY)
Admission: RE | Admit: 2021-04-07 | Discharge: 2021-04-07 | Disposition: A | Discharge status: Home / Self Care | Payer: Medicaid Other | Source: Ambulatory Visit | Attending: Advanced Practice Midwife | Admitting: Advanced Practice Midwife

## 2021-04-07 DIAGNOSIS — Z8742 Personal history of other diseases of the female genital tract: Secondary | ICD-10-CM | POA: Diagnosis not present

## 2021-04-07 DIAGNOSIS — Z124 Encounter for screening for malignant neoplasm of cervix: Secondary | ICD-10-CM | POA: Diagnosis not present

## 2021-04-07 DIAGNOSIS — Z30430 Encounter for insertion of intrauterine contraceptive device: Secondary | ICD-10-CM | POA: Diagnosis not present

## 2021-04-07 DIAGNOSIS — O24410 Gestational diabetes mellitus in pregnancy, diet controlled: Secondary | ICD-10-CM

## 2021-04-07 LAB — HM PAP SMEAR: HM Pap smear: NORMAL

## 2021-04-07 NOTE — Progress Notes (Signed)
Postpartum Visit  Chief Complaint:  Chief Complaint  Patient presents with   Postpartum Care    History of Present Illness: Patient is a 26 y.o. [id number] presents for postpartum visit.  Review the Delivery Report for details.   Date of delivery: 02/18/2021 Type of delivery: Vaginal delivery - Vacuum or forceps assisted  no Episiotomy No.  Laceration: no  Pregnancy or labor problems:  gestational diabetes- 2 hr gtt today Any problems since the delivery:  no  Newborn Details:  SINGLETON :  1. BabyGender female. Birth weight: 7# 14oz Maternal Details:  Breast or formula feeding: breastfeeding Intercourse: No  Contraception after delivery:  Paragard IUD Any bowel or bladder issues: No  Post partum depression/anxiety noted:  no Edinburgh Post-Partum Depression Score: 0 Date of last PAP: 11//3/21  no abnormalities   Review of Systems: Review of Systems  Constitutional:  Negative for chills and fever.  HENT:  Negative for congestion, ear discharge, ear pain, hearing loss, sinus pain and sore throat.   Eyes:  Negative for blurred vision and double vision.  Respiratory:  Negative for cough, shortness of breath and wheezing.   Cardiovascular:  Negative for chest pain, palpitations and leg swelling.  Gastrointestinal:  Negative for abdominal pain, blood in stool, constipation, diarrhea, heartburn, melena, nausea and vomiting.  Genitourinary:  Negative for dysuria, flank pain, frequency, hematuria and urgency.  Musculoskeletal:  Negative for back pain, joint pain and myalgias.  Skin:  Negative for itching and rash.  Neurological:  Negative for dizziness, tingling, tremors, sensory change, speech change, focal weakness, seizures, loss of consciousness, weakness and headaches.  Endo/Heme/Allergies:  Negative for environmental allergies. Does not bruise/bleed easily.  Psychiatric/Behavioral:  Negative for depression, hallucinations, memory loss, substance abuse and suicidal ideas. The  patient is not nervous/anxious and does not have insomnia.     Past Medical History:  Past Medical History:  Diagnosis Date   Diet controlled gestational diabetes mellitus 02/18/2021   Irritable bowel syndrome (IBS)    Medical history non-contributory    Ovarian cyst    Vaginal Pap smear, abnormal     Past Surgical History:  Past Surgical History:  Procedure Laterality Date   ADENOIDECTOMY W/ MYRINGOTOMY Bilateral    done as a child   CHOLECYSTECTOMY  05/27/2019   ERCP     TONSILLECTOMY      Family History:  Family History  Problem Relation Age of Onset   Heart disease Maternal Grandmother    Cancer Maternal Grandmother    Diabetes Maternal Grandmother    Cancer Maternal Grandfather    Hepatitis C Paternal Grandmother     Social History:  Social History   Socioeconomic History   Marital status: Married    Spouse name: Oaklei Klutz   Number of children: 3   Years of education: 12+   Highest education level: Associate degree: occupational, Hotel manager, or vocational program  Occupational History   Occupation: Homemaker  Tobacco Use   Smoking status: Never   Smokeless tobacco: Never   Tobacco comments:    Denies secondhand smoke.  Vaping Use   Vaping Use: Never used  Substance and Sexual Activity   Alcohol use: No   Drug use: No   Sexual activity: Yes    Birth control/protection: I.U.D.  Other Topics Concern   Not on file  Social History Narrative   Not on file   Social Determinants of Health   Financial Resource Strain: Low Risk    Difficulty of Paying  Living Expenses: Not very hard  Food Insecurity: No Food Insecurity   Worried About [place] in the Last Year: Never true   Ran Out of Food in the Last Year: Never true  Transportation Needs: No Transportation Needs   Lack of Transportation (Medical): No   Lack of Transportation (Non-Medical): No  Physical Activity: Not on file  Stress: Not on file  Social Connections: Not on file   Intimate Partner Violence: Not At Risk   Fear of Current or Ex-Partner: No   Emotionally Abused: No   Physically Abused: No   Sexually Abused: No    Allergies:  Allergies  Allergen Reactions   Azithromycin Nausea And Vomiting   Penicillins Other (See Comments)    States she had been noted to have allergy as a child- does not know what reaction was- has avoided taking it    Medications: Prior to Admission medications   Medication Sig Start Date End Date Taking? Authorizing Provider  ibuprofen (ADVIL) 600 MG tablet Take 1 tablet (600 mg total) by mouth every 6 (six) hours. 02/19/21  Yes Dominic, Nunzio Cobbs, CNM  Prenatal Vit-Fe Fumarate-FA (PRENATAL MULTIVITAMIN) TABS tablet Take 1 tablet by mouth daily at 12 noon. 10/01/20  Yes Junious Dresser, FNP    Physical Exam Blood pressure 100/70, height 5\' 4"  (1.626 m), weight 228 lb (103.4 kg), currently breastfeeding.    General: NAD HEENT: normocephalic, anicteric Pulmonary: No increased work of breathing Abdomen: NABS, soft, non-tender, non-distended.  Umbilicus without lesions.  No hepatomegaly, splenomegaly or masses palpable. No evidence of hernia. Genitourinary:  External: Normal external female genitalia.  Normal urethral meatus, normal  Bartholin's and Skene's glands.    Vagina: Normal vaginal mucosa, no evidence of prolapse.    Cervix: Grossly normal in appearance, no bleeding, no CMT  Uterus: Non-enlarged, mobile, normal contour.    Adnexa: ovaries non-enlarged, no adnexal masses  Rectal: deferred Extremities: no edema, erythema, or tenderness Neurologic: Grossly intact Psychiatric: mood appropriate, affect full   Edinburgh Postnatal Depression Scale - 04/07/21 0954       Edinburgh Postnatal Depression Scale:  In the Past 7 Days   I have been able to laugh and see the funny side of things. 0    I have looked forward with enjoyment to things. 0    I have blamed myself unnecessarily when things went wrong. 0    I have  been anxious or worried for no good reason. 0    I have felt scared or panicky for no good reason. 0    Things have been getting on top of me. 0    I have been so unhappy that I have had difficulty sleeping. 0    I have felt sad or miserable. 0    I have been so unhappy that I have been crying. 0    The thought of harming myself has occurred to me. 0    Edinburgh Postnatal Depression Scale Total 0             Assessment: 26 y.o. [id number] presenting for 6 week postpartum visit  Plan: Problem List Items Addressed This Visit       Other   History of abnormal cervical Pap smear   Relevant Orders   Cytology - PAP   Other Visit Diagnoses     6 weeks postpartum follow-up    -  Primary   Relevant Orders   Cytology - PAP   Encounter for insertion of copper  IUD       Screening for cervical cancer       Relevant Orders   Cytology - PAP        1) Contraception - Education given regarding options for contraception, as well as compatibility with breast feeding if applicable.  Patient plans on Paragard IUD for contraception.  2)  Pap - ASCCP guidelines and rationale discussed.   Patient opts for every 3 years screening interval  3) Patient underwent screening for postpartum depression with no signs of depression  4) Return in about 4 weeks (around 05/05/2021) for iud string check.   Rod Can, [facility] 04/07/2021, 10:17 AM     GYNECOLOGY OFFICE PROCEDURE NOTE  ETHYLENE WOODE is a 26 y.o. [phone] here for Paragard IUD insertion. No GYN concerns.  Last pap smear was on 12/17/19 and was normal. She had an abnormal PAP prior.  The patient is currently using postpartum abstinence for contraception and her LMP is No LMP recorded..  The indication for her IUD is contraception/cycle control.  IUD Insertion Procedure Note Patient identified, informed consent performed, consent signed.   Discussed risks of irregular bleeding, cramping,  infection, malpositioning, expulsion or uterine perforation of the IUD (1:1000 placements)  which may require further procedure such as laparoscopy.  IUD while effective at preventing pregnancy do not prevent transmission of sexually transmitted diseases and use of barrier methods for this purpose was discussed. Time out was performed.  Urine pregnancy test negative.  Speculum placed in the vagina.  Cervix visualized.  Cleaned with Betadine x 2.  Grasped posteriorly with a single tooth tenaculum.  Uterus sounded to 6 cm. IUD placed per manufacturer's recommendations.  Strings trimmed to 3 cm. Tenaculum was removed, good hemostasis noted.  Patient tolerated procedure well.   Patient was given post-procedure instructions.  She was advised to have backup contraception for one week.  Patient was also asked to check IUD strings periodically and follow up in 4-6 weeks for IUD check.  IUD insertion CPT 58300,  Skyla [id number] Mirena [id number] [place] [id number] Verdia Kuba [phone] Modifer 25, plus Modifer 79 is done during a global billing visit    Rod Can, [facility]  04/07/2021

## 2021-04-08 LAB — CYTOLOGY - PAP: Diagnosis: NEGATIVE

## 2021-04-08 LAB — GLUCOSE TOLERANCE, 2 HOURS
Glucose, 2 hour: 124 mg/dL (ref 70–139)
Glucose, GTT - Fasting: 80 mg/dL (ref 70–99)

## 2021-05-10 ENCOUNTER — Other Ambulatory Visit: Payer: Self-pay

## 2021-05-10 ENCOUNTER — Ambulatory Visit (INDEPENDENT_AMBULATORY_CARE_PROVIDER_SITE_OTHER): Payer: Medicaid Other | Admitting: Advanced Practice Midwife

## 2021-05-10 ENCOUNTER — Encounter: Payer: Self-pay | Admitting: Advanced Practice Midwife

## 2021-05-10 VITALS — BP 122/70 | Ht 64.0 in

## 2021-05-10 DIAGNOSIS — Z30431 Encounter for routine checking of intrauterine contraceptive device: Secondary | ICD-10-CM

## 2021-05-10 NOTE — Progress Notes (Signed)
? ? ? ?Obstetrics & Gynecology Office Visit  ? ?Chief Complaint:  ?Chief Complaint  ?Patient presents with  ? Follow-up  ? ? ?History of Present Illness: 26 y.o. patient presenting for follow up of Paragard IUD placement 4 weeks ago.  The indication for her IUD was contraception.  She denies any complications since her IUD placement.  Still having some occasional spotting, she reports having what she thinks was her first period since delivery.    ? ?Review of Systems: Review of Systems  ?Constitutional: Negative.   ?HENT: Negative.    ?Eyes: Negative.   ?Respiratory: Negative.    ?Cardiovascular: Negative.   ?Genitourinary: Negative.   ?Musculoskeletal: Negative.   ?Skin: Negative.   ?Neurological: Negative.   ?Endo/Heme/Allergies: Negative.   ?Psychiatric/Behavioral: Negative.    ? ?Past Medical History:  ?Past Medical History:  ?Diagnosis Date  ? Diet controlled gestational diabetes mellitus 02/18/2021  ? Irritable bowel syndrome (IBS)   ? Medical history non-contributory   ? Ovarian cyst   ? Vaginal Pap smear, abnormal   ? ? ?Past Surgical History:  ?Past Surgical History:  ?Procedure Laterality Date  ? ADENOIDECTOMY W/ MYRINGOTOMY Bilateral   ? done as a child  ? CHOLECYSTECTOMY  05/27/2019  ? ERCP    ? TONSILLECTOMY    ? ? ?Gynecologic History: No LMP recorded. (Menstrual status: IUD). ? ?Obstetric History: [id number] ? ?Family History:  ?Family History  ?Problem Relation Age of Onset  ? Heart disease Maternal Grandmother   ? Cancer Maternal Grandmother   ? Diabetes Maternal Grandmother   ? Cancer Maternal Grandfather   ? Hepatitis C Paternal Grandmother   ? ? ?Social History:  ?Social History  ? ?Socioeconomic History  ? Marital status: Married  ?  Spouse name: Ray Glacken  ? Number of children: 3  ? Years of education: 12+  ? Highest education level: Associate degree: occupational, Scientist, product/process development, or vocational program  ?Occupational History  ? Occupation: Homemaker  ?Tobacco Use  ? Smoking status: Never  ?  Smokeless tobacco: Never  ? Tobacco comments:  ?  Denies secondhand smoke.  ?Vaping Use  ? Vaping Use: Never used  ?Substance and Sexual Activity  ? Alcohol use: No  ? Drug use: No  ? Sexual activity: Yes  ?  Birth control/protection: I.U.D.  ?Other Topics Concern  ? Not on file  ?Social History Narrative  ? Not on file  ? ?Social Determinants of Health  ? ?Financial Resource Strain: Low Risk   ? Difficulty of Paying Living Expenses: Not very hard  ?Food Insecurity: No Food Insecurity  ? Worried About Programme researcher, broadcasting/film/video in the Last Year: Never true  ? Ran Out of Food in the Last Year: Never true  ?Transportation Needs: No Transportation Needs  ? Lack of Transportation (Medical): No  ? Lack of Transportation (Non-Medical): No  ?Physical Activity: Not on file  ?Stress: Not on file  ?Social Connections: Not on file  ?Intimate Partner Violence: Not At Risk  ? Fear of Current or Ex-Partner: No  ? Emotionally Abused: No  ? Physically Abused: No  ? Sexually Abused: No  ? ? ?Allergies:  ?Allergies  ?Allergen Reactions  ? Azithromycin Nausea And Vomiting  ? Penicillins Other (See Comments)  ?  States she had been noted to have allergy as a child- does not know what reaction was- has avoided taking it  ? ? ?Medications: ?Prior to Admission medications   ?Medication Sig Start Date End Date Taking? Authorizing  Provider  ?ibuprofen (ADVIL) 600 MG tablet Take 1 tablet (600 mg total) by mouth every 6 (six) hours. 02/19/21   Dominic, Courtney Heys, CNM  ?PARAGARD INTRAUTERINE COPPER IU by Intrauterine route. 04/07/21 04/08/31  Tresea Mall, CNM  ?Prenatal Vit-Fe Fumarate-FA (PRENATAL MULTIVITAMIN) TABS tablet Take 1 tablet by mouth daily at 12 noon. 10/01/20   Wendi Snipes, FNP  ? ? ?Physical Exam ?Blood pressure 122/70, height 5\' 4"  (1.626 m), currently breastfeeding. ?No LMP recorded. (Menstrual status: IUD). ? ?General: NAD ?HEENT: normocephalic, anicteric ?Pulmonary: No increased work of breathing ? ?Genitourinary: ? External:  Normal external female genitalia.  Normal urethral meatus, normal  Bartholin's and Skene's glands.   ? Vagina: Normal vaginal mucosa, no evidence of prolapse.   ? Cervix: Grossly normal in appearance, no bleeding, IUD strings visualized 3 cm. ? Rectal: deferred ?Extremities: no edema, erythema ?Neurologic: Grossly intact ?Psychiatric: mood appropriate, affect full ? ?Female chaperone present for pelvic and breast  portions of the physical exam ? ?Assessment: 26 y.o. 22 No problem-specific Assessment & Plan notes found for this encounter. ? ? ?Plan: ?Problem List Items Addressed This Visit   ?None ?Visit Diagnoses   ? ? IUD check up    -  Primary  ? ?  ? ? ? ?1.  The patient was given instructions to check her IUD strings monthly and call with any problems or concerns.  She should call for fevers, chills, abnormal vaginal discharge, pelvic pain, or other complaints. ? ?2.   IUDs while effective at preventing pregnancy do not prevent transmission of sexually transmitted diseases and use of barrier methods for this purpose was discussed.  Low overall incidence of failure with 99.7% efficacy rate in typical use.  The patient has not contraindication to IUD placement. ? ?3.  She will return for annual exam in 1 year.  All questions answered. ? ?4) A total of 15 minutes were spent in face-to-face contact with the patient during this encounter with over half of that time devoted to counseling and coordination of care. ? ?5) Return in about 1 year (around 05/11/2022) for annual established gyn. ? ? ?05/13/2022, SNM ?Lamont Snowball, CNM ?05/10/2021 9:56 AM   ?[facility], [facility] ?05/10/2021, 9:52 AM ?  ?

## 2021-09-05 ENCOUNTER — Emergency Department
Admission: EM | Admit: 2021-09-05 | Discharge: 2021-09-05 | Disposition: A | Discharge status: Home / Self Care | Payer: Medicaid Other | Attending: Emergency Medicine | Admitting: Emergency Medicine

## 2021-09-05 ENCOUNTER — Emergency Department: Payer: Medicaid Other

## 2021-09-05 ENCOUNTER — Encounter: Payer: Self-pay | Admitting: *Deleted

## 2021-09-05 ENCOUNTER — Other Ambulatory Visit: Payer: Self-pay

## 2021-09-05 DIAGNOSIS — R1030 Lower abdominal pain, unspecified: Secondary | ICD-10-CM | POA: Insufficient documentation

## 2021-09-05 DIAGNOSIS — N938 Other specified abnormal uterine and vaginal bleeding: Secondary | ICD-10-CM | POA: Insufficient documentation

## 2021-09-05 DIAGNOSIS — N939 Abnormal uterine and vaginal bleeding, unspecified: Secondary | ICD-10-CM

## 2021-09-05 LAB — URINALYSIS, ROUTINE W REFLEX MICROSCOPIC
Bilirubin Urine: NEGATIVE
Glucose, UA: NEGATIVE mg/dL
Ketones, ur: 20 mg/dL — AB
Nitrite: NEGATIVE
Protein, ur: NEGATIVE mg/dL
Specific Gravity, Urine: 1.026 (ref 1.005–1.030)
pH: 6 (ref 5.0–8.0)

## 2021-09-05 LAB — COMPREHENSIVE METABOLIC PANEL
ALT: 23 U/L (ref 0–44)
AST: 19 U/L (ref 15–41)
Albumin: 4.7 g/dL (ref 3.5–5.0)
Alkaline Phosphatase: 68 U/L (ref 38–126)
Anion gap: 8 (ref 5–15)
BUN: 14 mg/dL (ref 6–20)
CO2: 26 mmol/L (ref 22–32)
Calcium: 9.6 mg/dL (ref 8.9–10.3)
Chloride: 108 mmol/L (ref 98–111)
Creatinine, Ser: 0.95 mg/dL (ref 0.44–1.00)
GFR, Estimated: >60 mL/min (ref >60)
Glucose, Bld: 83 mg/dL (ref 70–99)
Potassium: 3.6 mmol/L (ref 3.5–5.1)
Sodium: 142 mmol/L (ref 135–145)
Total Bilirubin: 1.3 mg/dL — ABNORMAL HIGH (ref 0.3–1.2)
Total Protein: 7.6 g/dL (ref 6.5–8.1)

## 2021-09-05 LAB — POC URINE PREG, ED: Preg Test, Ur: NEGATIVE

## 2021-09-05 LAB — CBC
HCT: 46.2 % — ABNORMAL HIGH (ref 36.0–46.0)
Hemoglobin: 15 g/dL (ref 12.0–15.0)
MCH: 29.5 pg (ref 26.0–34.0)
MCHC: 32.5 g/dL (ref 30.0–36.0)
MCV: 90.8 fL (ref 80.0–100.0)
Platelets: 209 10*3/uL (ref 150–400)
RBC: 5.09 MIL/uL (ref 3.87–5.11)
RDW: 12.7 % (ref 11.5–15.5)
WBC: 7.8 10*3/uL (ref 4.0–10.5)
nRBC: 0 % (ref 0.0–0.2)

## 2021-09-05 LAB — LIPASE, BLOOD: Lipase: 33 U/L (ref 11–51)

## 2021-09-05 NOTE — ED Triage Notes (Signed)
Pt has low abd pain with vag bleeding.  Sx began for 2-3 days.  Denies urinary sx.  Pt has lower back pain  pt alert

## 2021-09-05 NOTE — ED Provider Notes (Signed)
[facility] Provider Note    Event Date/Time   First MD Initiated Contact with Patient 09/05/21 2044     (approximate)  History   Chief Complaint: Abdominal Pain and Vaginal Bleeding  HPI  Kristen Little is a 26 y.o. female with no significant past medical history presents to the emergency department for lower abdominal discomfort cramping and vaginal bleeding.  According to the patient she had an IUD placed approximately 6 months ago.  She states over the past month or so she has not been able to feel the strings of the IUD and over the past 3 days she has developed some abdominal cramping and vaginal bleeding.  Patient states she had intercourse on Saturday and since then has had pain in the lower abdomen.  States mild amount of bleeding.  No fever.  No discharge.  Physical Exam   Triage Vital Signs: ED Triage Vitals  Enc Vitals Group     BP 09/05/21 1722 128/83     Pulse Rate 09/05/21 1722 69     Resp 09/05/21 1722 16     Temp 09/05/21 1722 98.5 F (36.9 C)     Temp Source 09/05/21 1722 Oral     SpO2 09/05/21 1722 97 %     Weight 09/05/21 1941 205 lb (93 kg)     Height 09/05/21 1941 5\' 4"  (1.626 m)     Head Circumference --      Peak Flow --      Pain Score 09/05/21 1941 5     Pain Loc --      Pain Edu? --      Excl. in GC? --     Most recent vital signs: Vitals:   09/05/21 1722  BP: 128/83  Pulse: 69  Resp: 16  Temp: 98.5 F (36.9 C)  SpO2: 97%    General: Awake, no distress.  CV:  Good peripheral perfusion.  Regular rate and rhythm  Resp:  Normal effort.  Equal breath sounds bilaterally.  Abd:  No distention.  Soft, slight suprapubic tenderness to palpation.  No rebound or guarding    ED Results / Procedures / Treatments   RADIOLOGY  Ultrasound shows grossly normal position of the IUD   MEDICATIONS ORDERED IN ED: Medications - No data to display   IMPRESSION / MDM / ASSESSMENT AND PLAN / ED COURSE  I reviewed the triage  vital signs and the nursing notes.  Patient's presentation is most consistent with acute presentation with potential threat to life or bodily function.  Patient presents to the emergency department for lower abdominal pain/cramping and vaginal bleeding.  Patient states she has an IUD but has not been able to do feel the strings for the past 1 month.  Patient's lab work is reassuring with a normal CBC, normal chemistry reassuring urinalysis.  We will perform a pelvic examination to see if we can visualize IUD strings.  Patient will likely require ultrasound to rule out IUD migration.  Patient agreeable to plan.  On pelvic exam patient does appear to have strings protruding from the cervix.  We will obtain an ultrasound to evaluate IUD placement.  Ultrasound shows grossly normal position of the IUD.  Patient will be discharged from the emergency department.  Patient follow-up with her doctor.  FINAL CLINICAL IMPRESSION(S) / ED DIAGNOSES   Vaginal bleeding Lower abdominal pain  Note:  This document was prepared using Dragon voice recognition software and may include unintentional dictation errors.  Minna Antis, MD 09/05/21 [phone]

## 2021-09-13 ENCOUNTER — Ambulatory Visit: Payer: Medicaid Other

## 2022-07-14 IMAGING — US US OB < 14 WEEKS - US OB TV
1 series · 14 of 28 positions shown · non-contrast
Comparison: None this pregnancy.

CLINICAL DATA: Pregnant patient in first-trimester pregnancy with
cramping. Gestational age by LMP 7 weeks 4 days.

EXAM:
OBSTETRIC <14 WK US AND TRANSVAGINAL OB US
TECHNIQUE: Both transabdominal and transvaginal ultrasound examinations were
performed for complete evaluation of the gestation as well as the
maternal uterus, adnexal regions, and pelvic cul-de-sac.
Transvaginal technique was performed to assess early pregnancy.

[Series 1: us ob less than 14 weeks with ob transvaginal · 14 of 42 slices shown]
[im 2/42]
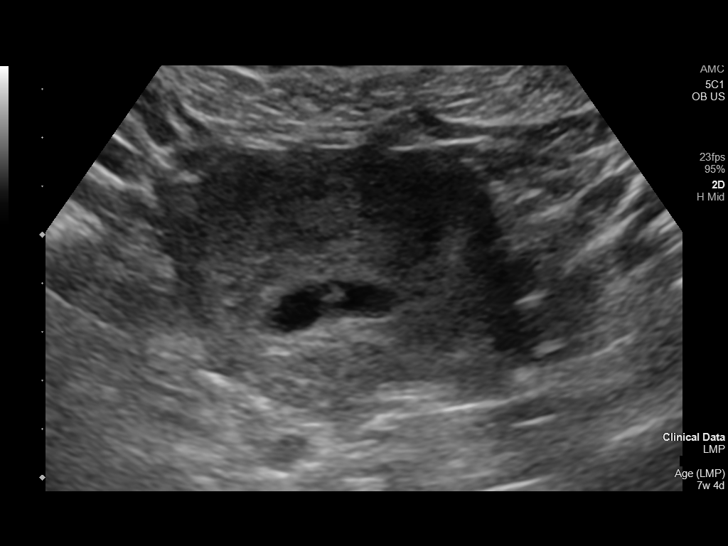
[im 5/42]
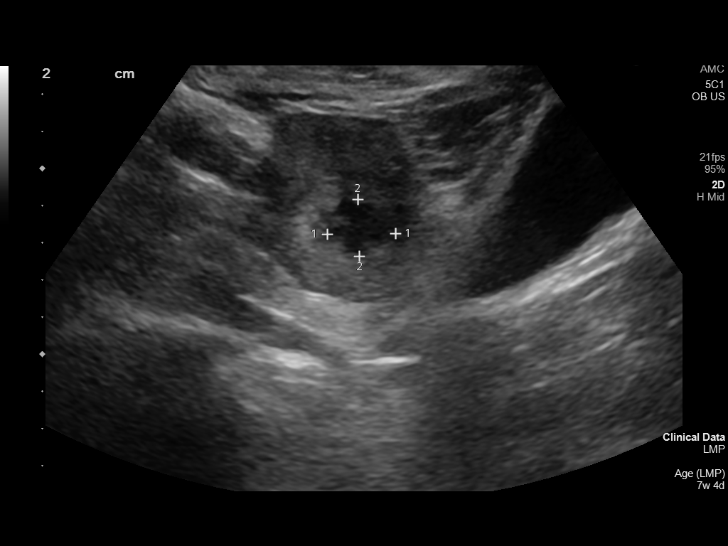
[im 8/42]
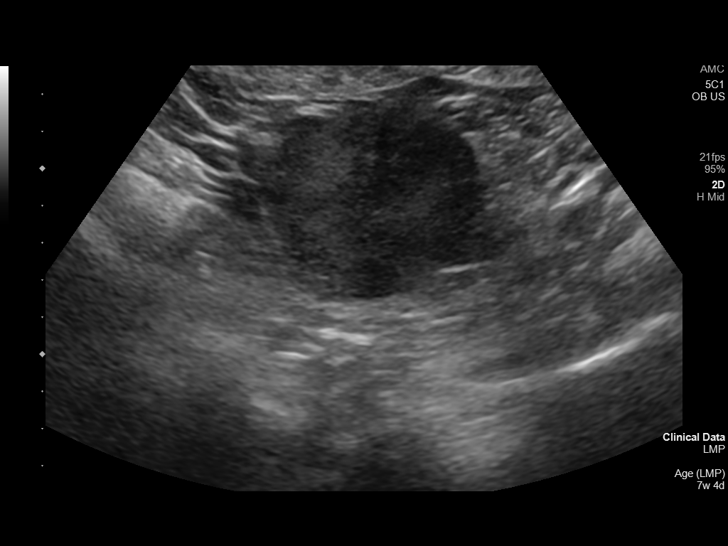
[im 11/42]
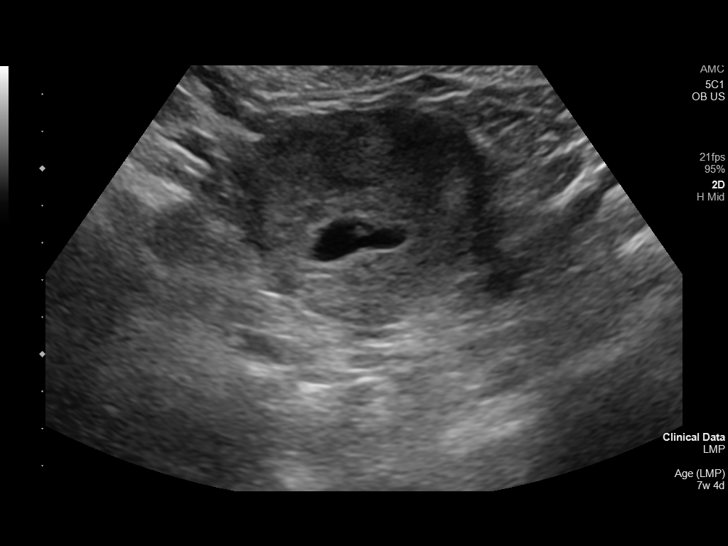
[im 14/42]
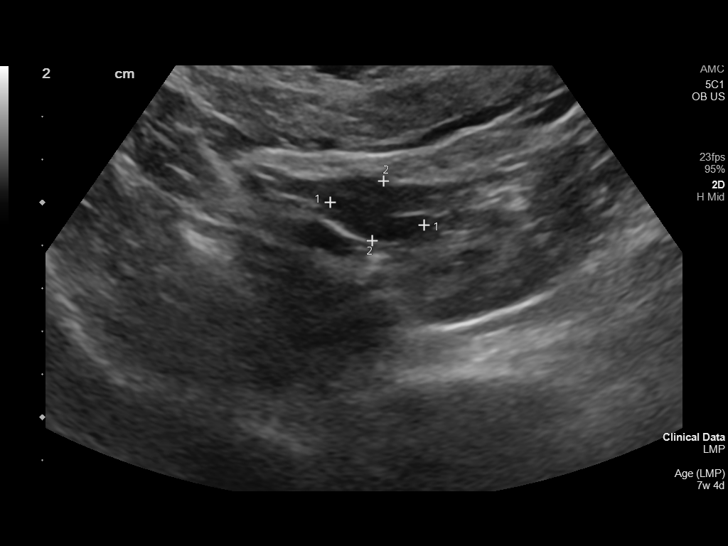
[im 17/42]
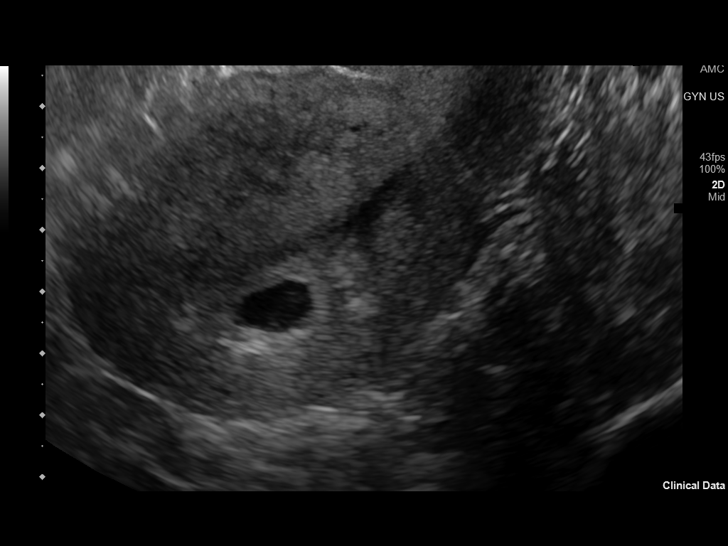
[im 20/42]
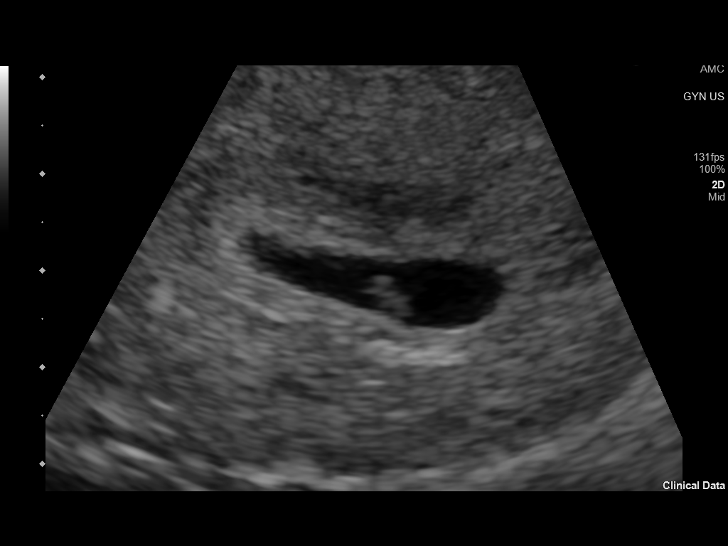
[im 23/42]
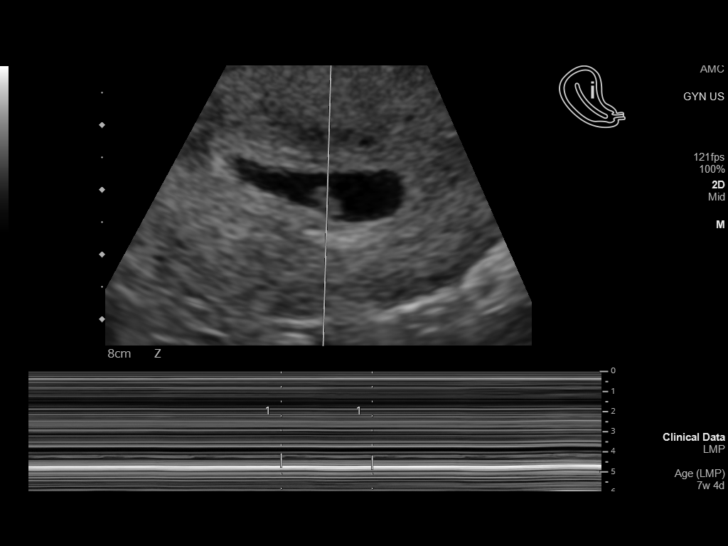
[im 26/42]
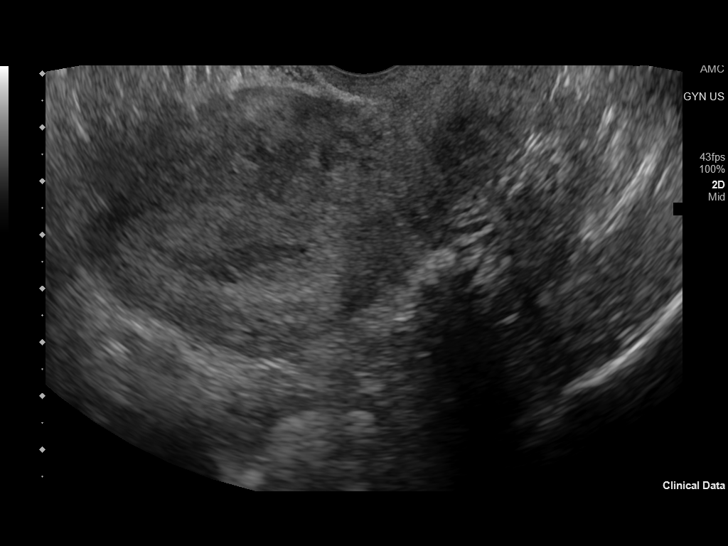
[im 29/42]
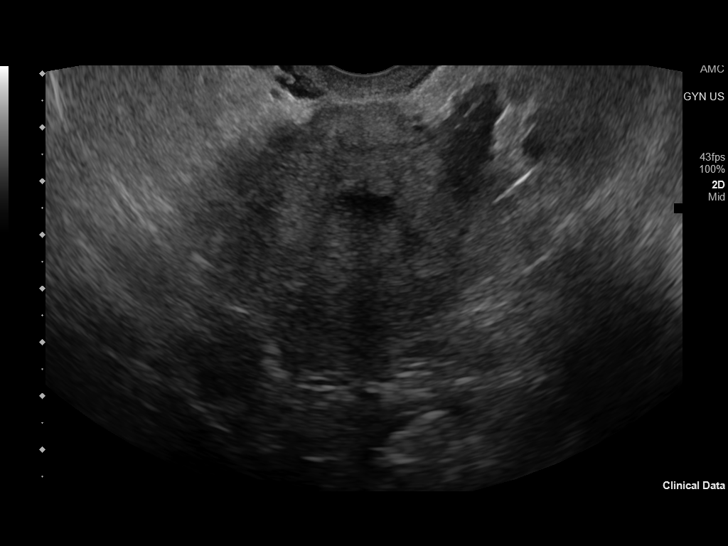
[im 32/42]
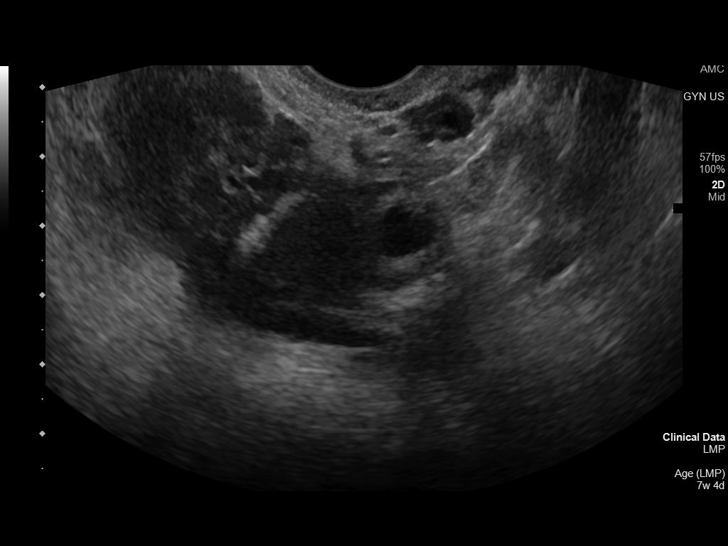
[im 35/42]
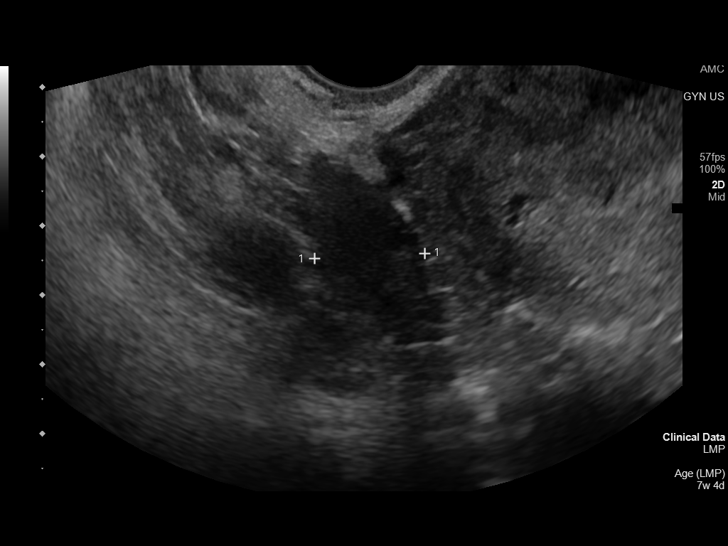
[im 38/42]
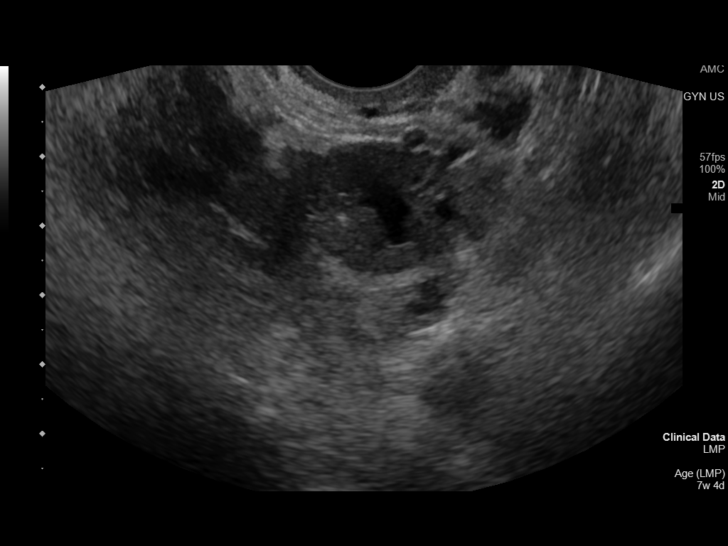
[im 42/42]
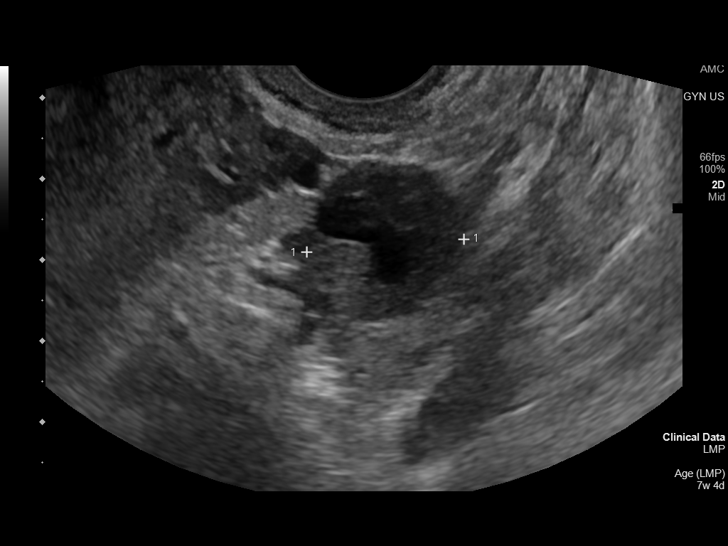

[14 of 28 positions shown; findings below may reference images not displayed]

FINDINGS: Intrauterine gestational sac: Single

Yolk sac:  Visualized.

Embryo:  Visualized.

Cardiac Activity: Visualized.

Heart Rate: 112 bpm

CRL: 5.1 mm   6 w   2 d                  US EDC: 02/23/2021

Subchorionic hemorrhage: Small to moderate measuring 2.2 x 1.7 x
cm lateral to the gestational sac.

Maternal uterus/adnexae: Right ovary is normal in size and contains
a small cyst. There is a corpus luteal cyst in the left ovary
measuring 2 cm. No adnexal mass. No pelvic free fluid.
IMPRESSION: 1. Single live intrauterine gestation estimated gestational age 6
weeks 2 days based on crown-rump length for ultrasound EDC
02/23/2021.
2. Small to moderate subchorionic hemorrhage.

## 2022-08-24 ENCOUNTER — Ambulatory Visit: Payer: Medicaid Other

## 2023-01-09 IMAGING — US US MFM FETAL BPP W/ NON-STRESS
1 series · 14 of 28 positions shown · non-contrast
Comparison: none

[Series 1: us mfm fetal bpp w/ non-stress · 32 acquisitions, 14 frames shown]
[im 2/32]
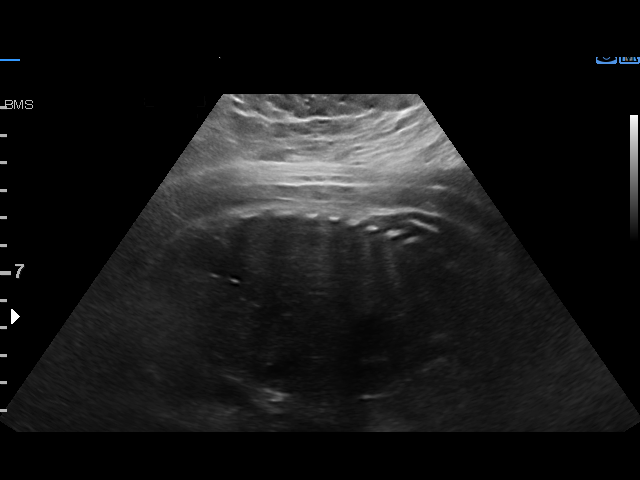
[im 4/32]
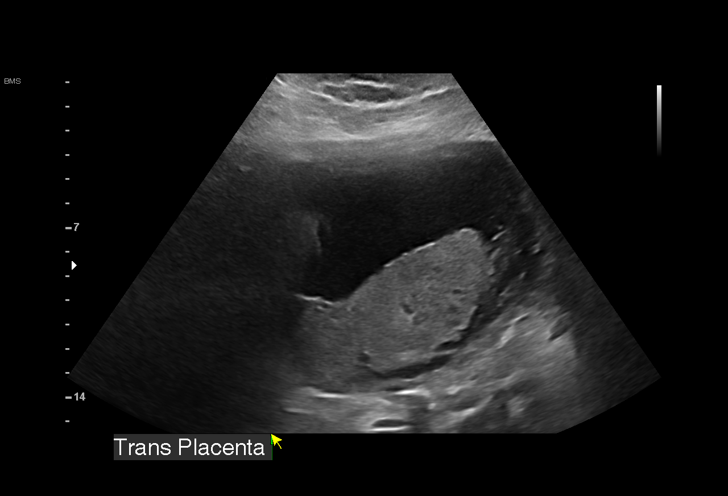
[im 6/32]
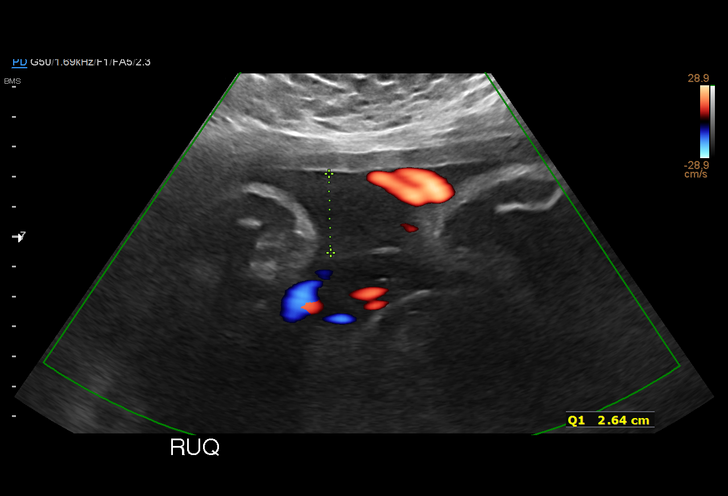
[im 9/32]
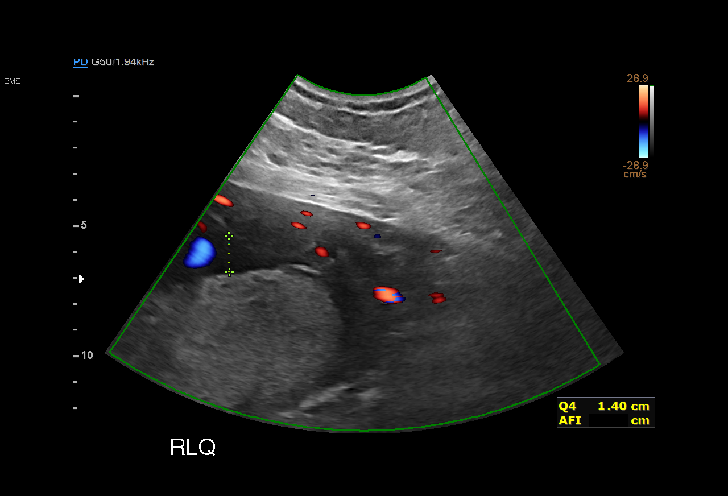
[im 11/32]
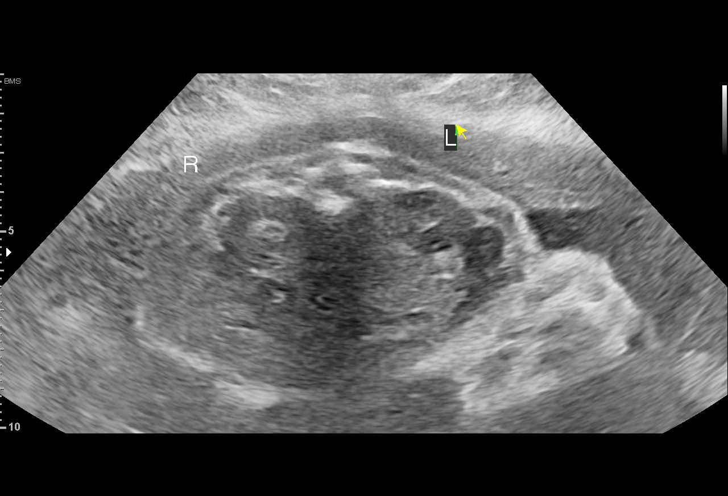
[im 13/32]
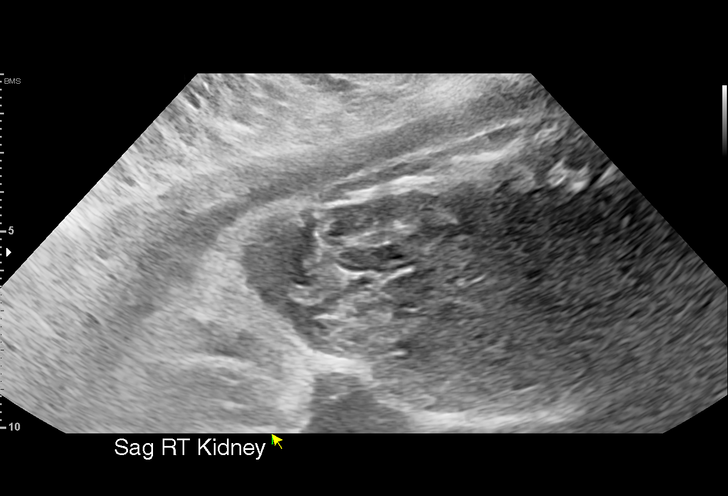
[im 15/32]
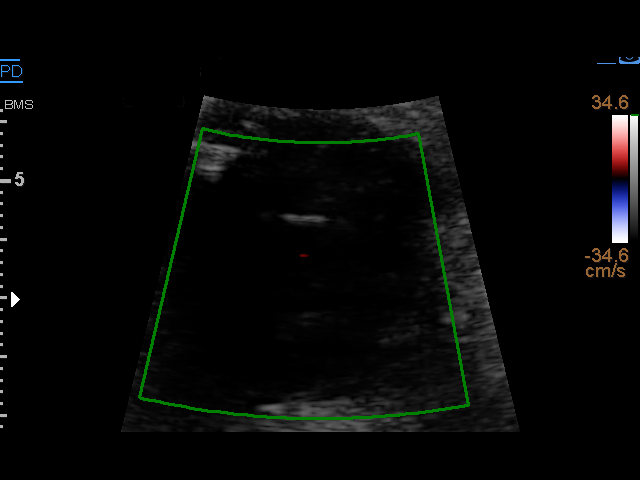
[im 18/32]
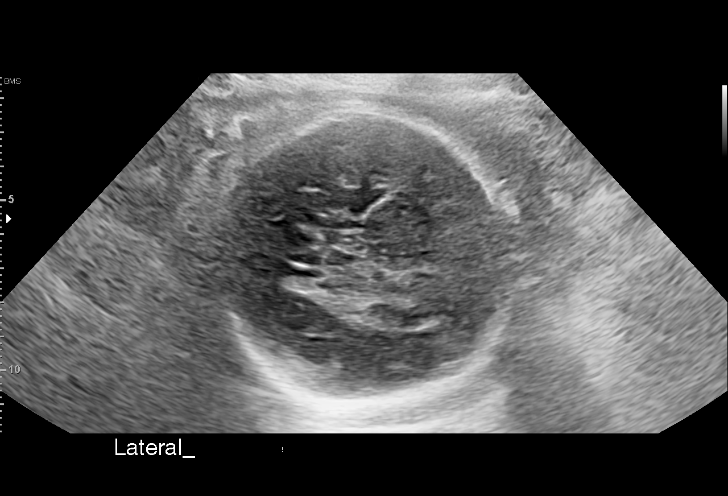
[im 20/32]
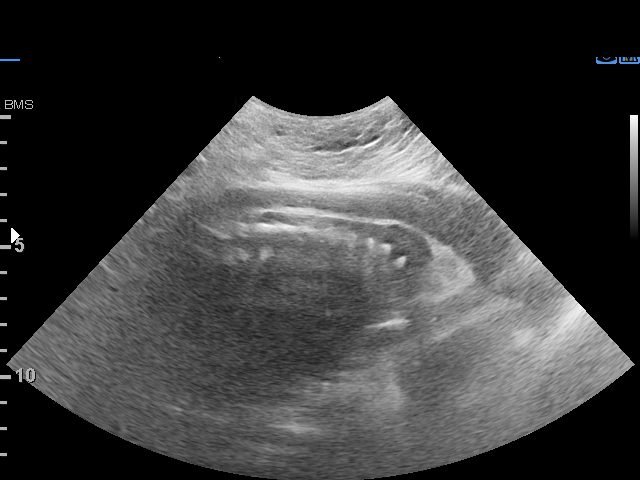
[im 22/32]
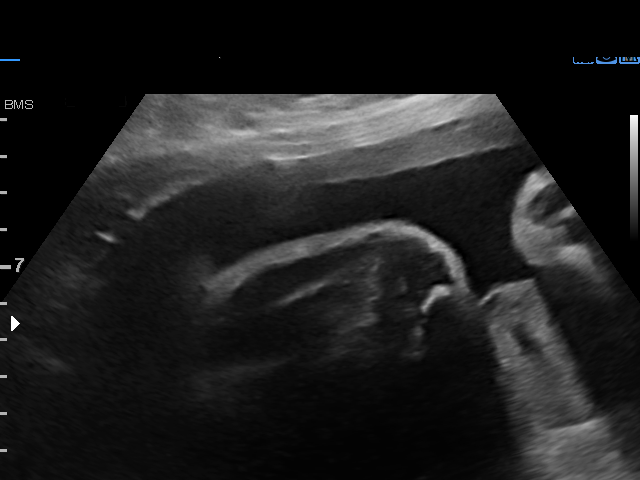
[im 25/32]
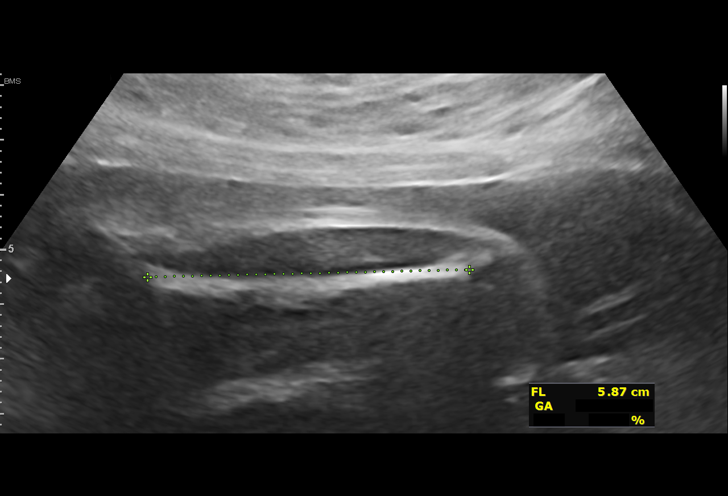
[im 27/32]
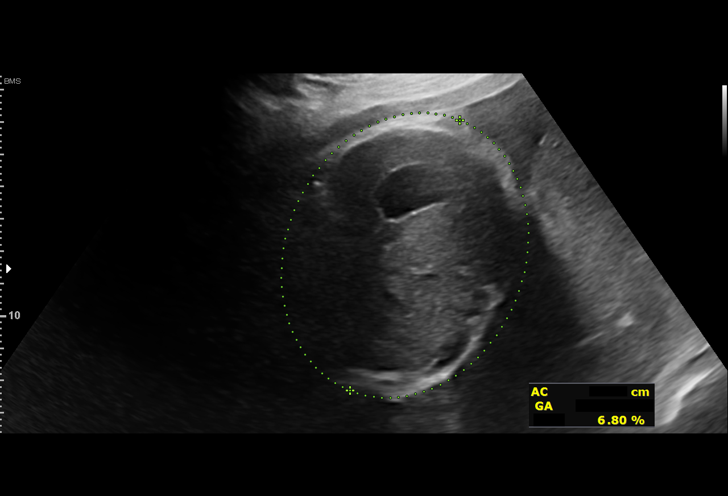
[im 29/32]
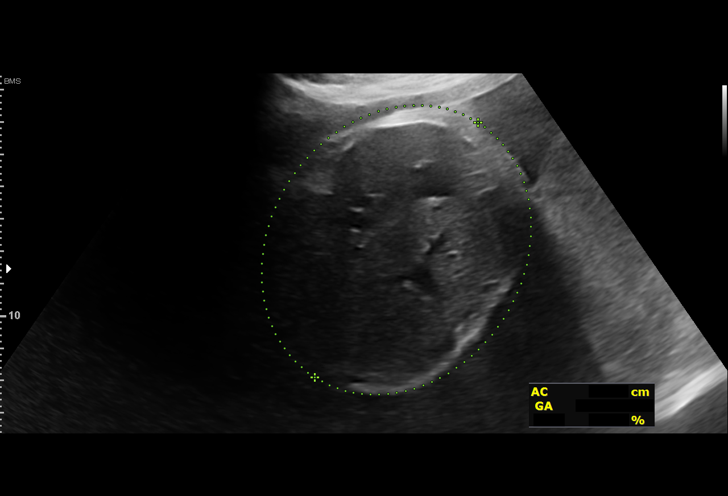
[im 32/32]
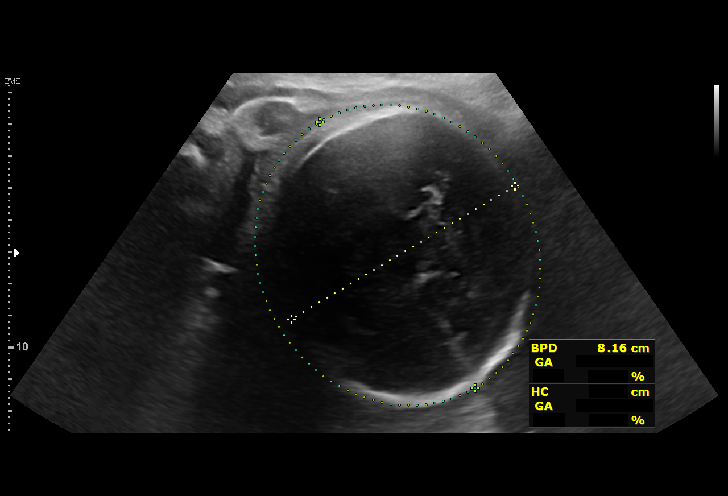

[14 of 28 positions shown; findings below may reference images not displayed]

W/NONSTRESS

Indications

 Obesity complicating pregnancy, second
 trimester
 Gestational diabetes in pregnancy, diet
 controlled
 Negative AFP Tetra
 31 weeks gestation of pregnancy
Fetal Evaluation

 Num Of Fetuses:         1
 Fetal Heart Rate(bpm):  166
 Cardiac Activity:       Observed
 Presentation:           Cephalic
 Placenta:               Posterior
 P. Cord Insertion:      Previously Visualized

 Amniotic Fluid
 AFI FV:      Within normal limits

 AFI Sum(cm)     %Tile       Largest Pocket(cm)
 11.6            28

 RUQ(cm)       RLQ(cm)       LUQ(cm)        LLQ(cm)

Biophysical Evaluation
 Amniotic F.V:   Pocket => 2 cm             F. Tone:        Observed
 F. Movement:    Observed                   N.S.T:          Reactive
 F. Breathing:   Not Observed               Score:          [DATE]
Biometry

 BPD:      81.4  mm     G. Age:  32w 5d         67  %    CI:        80.63   %    70 - 86
                                                         FL/HC:      21.1   %    19.1 -
 HC:      286.3  mm     G. Age:  31w 3d          9  %    HC/AC:      1.07        0.96 -
 AC:      267.3  mm     G. Age:  30w 6d         20  %    FL/BPD:     74.2   %    71 - 87
 FL:       60.4  mm     G. Age:  31w 3d         25  %    FL/AC:      22.6   %    20 - 24
 LV:          5  mm

 Est. FW:    1735  gm    3 lb 13 oz      21  %
OB History

 Gravidity:    4         Term:   3
 Living:       3
Gestational Age

 U/S Today:     31w 4d                                        EDD:   02/25/21
 Best:          31w 6d     Det. By:  Early Ultrasound         EDD:   02/23/21
                                     (07/02/20)
Anatomy

 Cranium:               Appears normal         Aortic Arch:            Previously seen
 Cavum:                 Appears normal         Ductal Arch:            Previously seen
 Ventricles:            Previously seen        Diaphragm:              Appears normal
 Choroid Plexus:        Previously seen        Stomach:                Appears normal, left
                                                                       sided
 Cerebellum:            Previously seen        Abdomen:                Appears normal
 Posterior Fossa:       Previously seen        Abdominal Wall:         Previously seen
 Nuchal Fold:           Not applicable (>20    Cord Vessels:           Appears normal (3
                        wks GA)                                        vessel cord)
 Face:                  Orbits and profile     Kidneys:                Appear normal
                        previously seen
 Lips:                  Previously seen        Bladder:                Appears normal
 Heart:                 Previously seen        Spine:                  Previously seen
 RVOT:                  Previously seen        Upper Extremities:      Previously seen
 LVOT:                  Previously seen        Lower Extremities:      Previously seen
Impression

 Follow up growth due to X9NBA
 Normal interval growth with measurements consistent with
 dates
 Good fetal movement and amniotic fluid volume

 Ms. Erxleben reports good blood sugars overall. She reports good
 fetal movement as well
Recommendations

 Follow up growth in 4-6 weeks.

## 2023-07-29 ENCOUNTER — Other Ambulatory Visit: Payer: Self-pay

## 2023-07-29 ENCOUNTER — Emergency Department

## 2023-07-29 ENCOUNTER — Emergency Department
Admission: EM | Admit: 2023-07-29 | Discharge: 2023-07-29 | Disposition: A | Discharge status: Home / Self Care | Attending: Emergency Medicine | Admitting: Emergency Medicine

## 2023-07-29 DIAGNOSIS — R5383 Other fatigue: Secondary | ICD-10-CM | POA: Insufficient documentation

## 2023-07-29 DIAGNOSIS — H538 Other visual disturbances: Secondary | ICD-10-CM | POA: Diagnosis not present

## 2023-07-29 LAB — CBC WITH DIFFERENTIAL/PLATELET
Abs Immature Granulocytes: 0.02 10*3/uL (ref 0.00–0.07)
Basophils Absolute: 0 10*3/uL (ref 0.0–0.1)
Basophils Relative: 0 %
Eosinophils Absolute: 0.1 10*3/uL (ref 0.0–0.5)
Eosinophils Relative: 2 %
HCT: 43.4 % (ref 36.0–46.0)
Hemoglobin: 14.5 g/dL (ref 12.0–15.0)
Immature Granulocytes: 0 %
Lymphocytes Relative: 32 %
Lymphs Abs: 2.4 10*3/uL (ref 0.7–4.0)
MCH: 30.3 pg (ref 26.0–34.0)
MCHC: 33.4 g/dL (ref 30.0–36.0)
MCV: 90.8 fL (ref 80.0–100.0)
Monocytes Absolute: 0.5 10*3/uL (ref 0.1–1.0)
Monocytes Relative: 7 %
Neutro Abs: 4.5 10*3/uL (ref 1.7–7.7)
Neutrophils Relative %: 59 %
Platelets: 223 10*3/uL (ref 150–400)
RBC: 4.78 MIL/uL (ref 3.87–5.11)
RDW: 12.6 % (ref 11.5–15.5)
WBC: 7.6 10*3/uL (ref 4.0–10.5)
nRBC: 0 % (ref 0.0–0.2)

## 2023-07-29 LAB — COMPREHENSIVE METABOLIC PANEL WITH GFR
ALT: 16 U/L (ref 0–44)
AST: 17 U/L (ref 15–41)
Albumin: 4.3 g/dL (ref 3.5–5.0)
Alkaline Phosphatase: 47 U/L (ref 38–126)
Anion gap: 8 (ref 5–15)
BUN: 19 mg/dL (ref 6–20)
CO2: 25 mmol/L (ref 22–32)
Calcium: 9.1 mg/dL (ref 8.9–10.3)
Chloride: 105 mmol/L (ref 98–111)
Creatinine, Ser: 0.85 mg/dL (ref 0.44–1.00)
GFR, Estimated: >60 mL/min (ref >60)
Glucose, Bld: 90 mg/dL (ref 70–99)
Potassium: 3.8 mmol/L (ref 3.5–5.1)
Sodium: 138 mmol/L (ref 135–145)
Total Bilirubin: 0.9 mg/dL (ref 0.0–1.2)
Total Protein: 7.3 g/dL (ref 6.5–8.1)

## 2023-07-29 LAB — TSH: TSH: 1.458 u[IU]/mL (ref 0.350–4.500)

## 2023-07-29 LAB — HCG, QUANTITATIVE, PREGNANCY: hCG, Beta Chain, Quant, S: <1 m[IU]/mL (ref <5)

## 2023-07-29 LAB — TROPONIN I (HIGH SENSITIVITY): Troponin I (High Sensitivity): <2 ng/L (ref <18)

## 2023-07-29 NOTE — Discharge Instructions (Addendum)
 Please go to your primary care doctor's appointment on Thursday.  You can try over-the-counter lubricating eyedrops like refresh to see if that helps with the blurry vision.  Try to reduce screen time as well.

## 2023-07-29 NOTE — ED Notes (Signed)
Blue top sent down as well

## 2023-07-29 NOTE — ED Provider Notes (Signed)
 [facility] Provider Note    Event Date/Time   First MD Initiated Contact with Patient 07/29/23 1539     (approximate)   History   Fatigue (X 2 months)   HPI  Kristen Little is a 28 y.o. female with history of migraines, IBS, presenting with blurry vision this morning.  States that the blurriness is improving.  No double vision, no headaches, no focal weakness or numbness, no facial asymmetry.  No eye pain or itching.  States that she does wear glasses and contacts.  States that she has been having 2 months of fatigue, intermittent lightheadedness, intermittent headaches, intermittent chest pain.  No shortness of breath, no chest pain this time.  Is scheduled to see her primary care doctor on Thursday.  She denies any infectious symptoms, no cough, urinary symptoms, nausea, vomiting, diarrhea.      Physical Exam   Triage Vital Signs: ED Triage Vitals [07/29/23 1459]  Encounter Vitals Group     BP (!) 136/96     Girls Systolic BP Percentile      Girls Diastolic BP Percentile      Boys Systolic BP Percentile      Boys Diastolic BP Percentile      Pulse Rate 78     Resp 16     Temp 98 F (36.7 C)     Temp Source Oral     SpO2 100 %     Weight 192 lb (87.1 kg)     Height 5' 4 (1.626 m)     Head Circumference      Peak Flow      Pain Score 0     Pain Loc      Pain Education      Exclude from Growth Chart     Most recent vital signs: Vitals:   07/29/23 1459  BP: (!) 136/96  Pulse: 78  Resp: 16  Temp: 98 F (36.7 C)  SpO2: 100%     General: Awake, no distress.  CV:  Good peripheral perfusion.  Resp:  Normal effort.  No tachypnea or respiratory distress Abd:  No distention.  Soft nontender Other:  Pupils are equal and reactive, extraocular movements are intact, no conjunctival injection, no facial asymmetry, no slurred speech, no focal weakness or numbness, she is steady gait with ambulation.   ED Results / Procedures / Treatments    Labs (all labs ordered are listed, but only abnormal results are displayed) Labs Reviewed  CBC WITH DIFFERENTIAL/PLATELET  COMPREHENSIVE METABOLIC PANEL WITH GFR  TSH  HCG, QUANTITATIVE, PREGNANCY  TROPONIN I (HIGH SENSITIVITY)     EKG  EKG shows, sinus rhythm heart rate 66, normal QRS, normal QTc, no ischemic ST elevation, no T wave changes, not sig change compared to prior   RADIOLOGY On my independent interpretation, CT head without obvious ICH   PROCEDURES:  Critical Care performed: No  Procedures   MEDICATIONS ORDERED IN ED: Medications - No data to display   IMPRESSION / MDM / ASSESSMENT AND PLAN / ED COURSE  I reviewed the triage vital signs and the nursing notes.                              Differential diagnosis includes, but is not limited to, consider CVA but she has no focal deficits on exam at this time, also considered mass, ocular migraine, thyroid dysfunction, electrolyte derangements, dry eyes.  Will get labs, TSH, CT head.  Patient's presentation is most consistent with acute presentation with potential threat to life or bodily function.  Independent interpretation of labs and imaging below.  On reassessment patient has no headache, states his blurry vision is improving, it resolves when she focuses the vision on something.  Discussed with her about trying some lubricating drops for dry eyes.  Discussed with her about following up with her primary care doctor on Thursday for further management of her symptoms.  Discussed imaging and lab results including incidental findings.  Considered but no indication for inpatient admission at this time, she safe for outpatient management.  Will discharge with strict return precautions.  Shared decision making done with patient and she is agreeable with this plan.    Clinical Course as of 07/29/23 1745  Sun Jul 29, 2023  1621 Independent review of labs, troponins not elevated, TSH is normal, hCG is not  elevated, electrolytes fairly deranged, no leukocytosis. [TT]  1741 CT Head Wo Contrast No acute intracranial abnormality.  [TT]    Clinical Course User Index [TT] Drenda Gentle, Richard Champion, MD     FINAL CLINICAL IMPRESSION(S) / ED DIAGNOSES   Final diagnoses:  Other fatigue  Blurry vision, bilateral     Rx / DC Orders   ED Discharge Orders     None        Note:  This document was prepared using Dragon voice recognition software and may include unintentional dictation errors.    Shane Darling, MD 07/29/23 [id number]

## 2023-07-29 NOTE — ED Triage Notes (Addendum)
 Pt to ed from [facility] for dizziness and blurred vision with some generalized fatigue. Pt states I have never seen a PCP and I have a new apt coming up a week from Thursday but I have been having dizziness and blurred vision for the last few months and felt I needed to be seen and couldn't wait. Pt is caox4, in no acute distress and ambulatory in triage. Pt denies any CP or SOB.

## 2023-08-09 ENCOUNTER — Encounter: Payer: Self-pay | Admitting: Family Medicine

## 2023-08-09 ENCOUNTER — Ambulatory Visit (INDEPENDENT_AMBULATORY_CARE_PROVIDER_SITE_OTHER): Admitting: Family Medicine

## 2023-08-09 VITALS — BP 118/70 | HR 80 | Temp 98.4°F | Ht 64.0 in | Wt 193.8 lb

## 2023-08-09 DIAGNOSIS — Z713 Dietary counseling and surveillance: Secondary | ICD-10-CM

## 2023-08-09 DIAGNOSIS — Z13220 Encounter for screening for lipoid disorders: Secondary | ICD-10-CM

## 2023-08-09 DIAGNOSIS — Z6833 Body mass index (BMI) 33.0-33.9, adult: Secondary | ICD-10-CM

## 2023-08-09 DIAGNOSIS — F419 Anxiety disorder, unspecified: Secondary | ICD-10-CM

## 2023-08-09 DIAGNOSIS — F32A Depression, unspecified: Secondary | ICD-10-CM

## 2023-08-09 DIAGNOSIS — Z136 Encounter for screening for cardiovascular disorders: Secondary | ICD-10-CM

## 2023-08-09 MED ORDER — BUPROPION HCL ER (XL) 150 MG PO TB24: 150.0000 mg | ORAL_TABLET | Freq: Every day | ORAL | 1 refills | Status: DC
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
## 2023-08-10 ENCOUNTER — Ambulatory Visit: Payer: Self-pay | Admitting: Family Medicine

## 2023-08-10 LAB — LIPID PANEL
Chol/HDL Ratio: 2.7 ratio (ref 0.0–4.4)
Cholesterol, Total: 159 mg/dL (ref 100–199)
HDL: 59 mg/dL (ref >39)
LDL Chol Calc (NIH): 88 mg/dL (ref 0–99)
Triglycerides: 58 mg/dL (ref 0–149)
VLDL Cholesterol Cal: 12 mg/dL (ref 5–40)

## 2023-08-10 LAB — HEMOGLOBIN A1C
Est. average glucose Bld gHb Est-mCnc: 100 mg/dL
Hgb A1c MFr Bld: 5.1 % (ref 4.8–5.6)

## 2023-08-14 ENCOUNTER — Telehealth: Payer: Self-pay

## 2023-08-14 NOTE — Telephone Encounter (Signed)
 Agree with request. Does she needs to be seen for her mental health?

## 2023-08-14 NOTE — Telephone Encounter (Signed)
 Copied from CRM [id number]. Topic: General - Other >> Aug 14, 2023  3:55 PM Essie A wrote: Reason for CRM: Patient would like to know if Dr. Sol would write her a note to be off work for 7 days (July 20-26, 2025).  She is trying to take time off for mental health but her job won't let her off unless she has a signed note from the doctor.  Please return her call at [phone].

## 2023-08-15 NOTE — Telephone Encounter (Signed)
 I have sent patient letter through [facility] to be out of work.  JM

## 2023-09-24 ENCOUNTER — Encounter: Payer: Self-pay | Admitting: Family Medicine

## 2023-09-24 ENCOUNTER — Ambulatory Visit (INDEPENDENT_AMBULATORY_CARE_PROVIDER_SITE_OTHER): Admitting: Family Medicine

## 2023-09-24 VITALS — BP 108/76 | HR 73 | Ht 64.0 in | Wt 185.0 lb

## 2023-09-24 DIAGNOSIS — F419 Anxiety disorder, unspecified: Secondary | ICD-10-CM | POA: Insufficient documentation

## 2023-09-24 DIAGNOSIS — F32A Depression, unspecified: Secondary | ICD-10-CM

## 2023-09-24 MED ORDER — BUPROPION HCL ER (XL) 300 MG PO TB24: 300.0000 mg | ORAL_TABLET | Freq: Every day | ORAL | 1 refills | Status: DC

## 2023-09-24 NOTE — Assessment & Plan Note (Signed)
 Improved, increasing dose of Wellbutrin to 300 mg daily, regarding anxiety discussed about mindfulness.  If no improvement in few weeks patient will let me know through MyChart we can prescribe her hydroxyzine as needed.

## 2023-09-24 NOTE — Progress Notes (Signed)
   Established Patient Office Visit  Subjective   Patient ID: LAKEYN DOKKEN, female    DOB: [birth year]  Age: 28 y.o. MRN: [id number]  Chief Complaint  Patient presents with   Mood     Assessment & Plan:   Problem List Items Addressed This Visit       Other   Anxiety and depression - Primary   Improved, increasing dose of Wellbutrin to 300 mg daily, regarding anxiety discussed about mindfulness.  If no improvement in few weeks patient will let me know through MyChart we can prescribe her hydroxyzine as needed.      Relevant Medications   buPROPion (WELLBUTRIN XL) 300 MG 24 hr tablet    Return in about 2 months (around 11/24/2023) for Mood with PCP.   Here for  Mood follow up.  Reports her mood improved. She started going to gym. Reports she quit he rjob and she is going to join new job. She is worried about it and feels its making her more anxious.  Discussed about dose increase, pt verbalized understanding.      Review of Systems  All other systems reviewed and are negative.     Objective:     BP 108/76   Pulse 73   Ht 5' 4 (1.626 m)   Wt 185 lb (83.9 kg)   SpO2 99%   BMI 31.76 kg/m  Wt Readings from Last 3 Encounters:  09/24/23 185 lb (83.9 kg)  08/09/23 193 lb 12.8 oz (87.9 kg)  07/29/23 192 lb (87.1 kg)      Physical Exam Vitals and nursing note reviewed.  Constitutional:      Appearance: Normal appearance.  HENT:     Head: Normocephalic.     Right Ear: External ear normal.     Left Ear: External ear normal.  Eyes:     Conjunctiva/sclera: Conjunctivae normal.  Cardiovascular:     Rate and Rhythm: Normal rate.  Pulmonary:     Effort: Pulmonary effort is normal. No respiratory distress.  Abdominal:     Palpations: Abdomen is soft.  Musculoskeletal:        General: Normal range of motion.  Skin:    General: Skin is warm.  Neurological:     Mental Status: She is alert and oriented to person, place, and time.  Psychiatric:        Mood and  Affect: Mood normal.      Results for orders placed or performed in visit on 09/24/23  HM PAP SMEAR  Result Value Ref Range   HM Pap smear WINL- Normal        The ASCVD Risk score (Arnett DK, et al., 2019) failed to calculate for the following reasons:   The 2019 ASCVD risk score is only valid for ages 61 to 110      Ladoris MARLA Ny, MD

## 2023-09-27 ENCOUNTER — Ambulatory Visit: Admitting: Family Medicine

## 2023-10-04 ENCOUNTER — Ambulatory Visit
Admission: EM | Admit: 2023-10-04 | Discharge: 2023-10-04 | Disposition: A | Discharge status: Home / Self Care | Attending: Physician Assistant | Admitting: Physician Assistant

## 2023-10-04 DIAGNOSIS — R0981 Nasal congestion: Secondary | ICD-10-CM | POA: Insufficient documentation

## 2023-10-04 DIAGNOSIS — U071 COVID-19: Secondary | ICD-10-CM | POA: Insufficient documentation

## 2023-10-04 LAB — RESP PANEL BY RT-PCR (FLU A&B, COVID) ARPGX2
Influenza A by PCR: NEGATIVE
Influenza B by PCR: NEGATIVE
SARS Coronavirus 2 by RT PCR: POSITIVE — AB

## 2023-10-04 NOTE — ED Triage Notes (Signed)
 Body aches, runny nose, nausea, congestion, sinus pressure x 2 days. Pt stated 5 days ago she felt bad for 1 day then it went away and came back yesterday.  Taking tylenol .

## 2023-10-04 NOTE — Discharge Instructions (Addendum)
-   COVID-positive.  Isolate until fever free 24 hours and symptoms improving. - May use over-the-counter medication such as Mucinex, Tylenol  Motrin , rest and fluids. - Return if uncontrolled fever, weakness or breathing difficulty.

## 2023-10-04 NOTE — ED Provider Notes (Signed)
 [facility]    CSN: [id number] Arrival date & time: 10/04/23  9177      History   Chief Complaint Chief Complaint  Patient presents with   Facial Pain   Generalized Body Aches    HPI Kristen Little is a 28 y.o. female presenting for feeling feverish with, fatigue, congestion, sinus pressure, and body aches x 2 days.  Denies cough, ear pain, sinus pain, chest pain, wheezing, shortness of breath, abdominal pain, vomiting or diarrhea.   Patient has been taking over-the-counter meds. Requests flu and COVID testing for work. No other complaints.  HPI  Past Medical History:  Diagnosis Date   Allergy 1997   Anxiety 02/27   Depression 04/11/21   Diet controlled gestational diabetes mellitus 02/18/2021   Irritable bowel syndrome (IBS)    Medical history non-contributory    Ovarian cyst    Vaginal Pap smear, abnormal     Patient Active Problem List   Diagnosis Date Noted   Anxiety and depression 09/24/2023   History of abnormal cervical Pap smear 04/07/2021   Diet controlled gestational diabetes mellitus 02/18/2021   Irregular uterine contractions 01/10/2021   Migraines 07/04/2019    Past Surgical History:  Procedure Laterality Date   ADENOIDECTOMY W/ MYRINGOTOMY Bilateral    done as a child   CHOLECYSTECTOMY  05/27/2019   ERCP     TONSILLECTOMY      OB History     Gravida  4   Para  4   Term  4   Preterm  0   AB  0   Living  4      SAB  0   IAB  0   Ectopic  0   Multiple  0   Live Births  4        Obstetric Comments  Observed for high BP- resolved, Vaginal bleeding- query post v/e, decreased Fetal movement- resolved, right side Ovarian cyst- resolved          Home Medications    Prior to Admission medications   Medication Sig Start Date End Date Taking? Authorizing Provider  buPROPion  (WELLBUTRIN  XL) 300 MG 24 hr tablet Take 1 tablet (300 mg total) by mouth daily. 09/24/23  Yes Kotturi, Vinay K, MD  PARAGARD  INTRAUTERINE  COPPER  IU by Intrauterine route. 04/07/21 04/08/31  Lynda Bradley, CNM    Family History Family History  Problem Relation Age of Onset   Heart disease Maternal Grandmother    Cancer Maternal Grandmother    Diabetes Maternal Grandmother    Obesity Maternal Grandmother    Cancer Maternal Grandfather    Hepatitis C Paternal Grandmother    Anxiety disorder Mother    Depression Mother    Drug abuse Father    Miscarriages / [place] Sister     Social History Social History   Tobacco Use   Smoking status: Never   Smokeless tobacco: Never   Tobacco comments:    Denies secondhand smoke.  Vaping Use   Vaping status: Never Used  Substance Use Topics   Alcohol use: No   Drug use: No     Allergies   Azithromycin  and Penicillins   Review of Systems Review of Systems  Constitutional:  Positive for fatigue and fever (subjective). Negative for chills and diaphoresis.  HENT:  Positive for rhinorrhea and sinus pressure. Negative for congestion, ear pain and sore throat.   Respiratory:  Negative for cough and shortness of breath.   Cardiovascular:  Negative for chest  pain.  Gastrointestinal:  Negative for abdominal pain, nausea and vomiting.  Musculoskeletal:  Positive for myalgias.  Skin:  Negative for rash.  Neurological:  Positive for headaches. Negative for weakness.  Hematological:  Negative for adenopathy.     Physical Exam Triage Vital Signs ED Triage Vitals  Encounter Vitals Group     BP 10/04/23 0846 103/80     Girls Systolic BP Percentile --      Girls Diastolic BP Percentile --      Boys Systolic BP Percentile --      Boys Diastolic BP Percentile --      Pulse Rate 10/04/23 0846 82     Resp 10/04/23 0846 16     Temp 10/04/23 0846 98.3 F (36.8 C)     Temp Source 10/04/23 0846 Oral     SpO2 10/04/23 0846 99 %     Weight --      Height --      Head Circumference --      Peak Flow --      Pain Score 10/04/23 0839 5     Pain Loc --      Pain Education --       Exclude from Growth Chart --    No data found.  Updated Vital Signs BP 103/80 (BP Location: Left Arm)   Pulse 82   Temp 98.3 F (36.8 C) (Oral)   Resp 16   LMP 09/06/2023 (Approximate)   SpO2 99%      Physical Exam Vitals and nursing note reviewed.  Constitutional:      General: She is not in acute distress.    Appearance: Normal appearance. She is not ill-appearing or toxic-appearing.  HENT:     Head: Normocephalic and atraumatic.     Nose: Congestion present.     Mouth/Throat:     Mouth: Mucous membranes are moist.     Pharynx: Oropharynx is clear.  Eyes:     General: No scleral icterus.       Right eye: No discharge.        Left eye: No discharge.     Conjunctiva/sclera: Conjunctivae normal.  Cardiovascular:     Rate and Rhythm: Normal rate and regular rhythm.     Heart sounds: Normal heart sounds.  Pulmonary:     Effort: Pulmonary effort is normal. No respiratory distress.     Breath sounds: Normal breath sounds.  Musculoskeletal:     Cervical back: Neck supple.  Skin:    General: Skin is dry.  Neurological:     General: No focal deficit present.     Mental Status: She is alert. Mental status is at baseline.     Motor: No weakness.     Gait: Gait normal.  Psychiatric:        Mood and Affect: Mood normal.        Behavior: Behavior normal.      UC Treatments / Results  Labs (all labs ordered are listed, but only abnormal results are displayed) Labs Reviewed  RESP PANEL BY RT-PCR (FLU A&B, COVID) ARPGX2 - Abnormal; Notable for the following components:      Result Value   SARS Coronavirus 2 by RT PCR POSITIVE (*)    All other components within normal limits    EKG   Radiology No results found.  Procedures Procedures (including critical care time)  Medications Ordered in UC Medications - No data to display  Initial Impression / Assessment and Plan / UC  Course  I have reviewed the triage vital signs and the nursing notes.  Pertinent  labs & imaging results that were available during my care of the patient were reviewed by me and considered in my medical decision making (see chart for details).   28 year old female presents for feeling feverish with fatigue, congestion, sinus pressure, headaches and body aches x 2 days.  No recorded fever, sore throat, cough, chest pain or shortness of breath.  Request COVID and flu testing.  Vitals are all stable and normal and she is overall well-appearing.  No acute distress.  On exam she has mild nasal congestion.  Throat is clear.  Chest clear.  Heart regular rate and rhythm.  Respiratory panel obtained. +COVID.   Reviewed results with patient. Discussed current CDC guidelines, isolation protocol and ED precautions. Advised OTC meds. Work note given.   Final Clinical Impressions(s) / UC Diagnoses   Final diagnoses:  COVID-19  Nasal congestion     Discharge Instructions      - COVID-positive.  Isolate until fever free 24 hours and symptoms improving. - May use over-the-counter medication such as Mucinex, Tylenol  Motrin , rest and fluids. - Return if uncontrolled fever, weakness or breathing difficulty.     ED Prescriptions   None    PDMP not reviewed this encounter.   Arvis Jolan NOVAK, PA-C 10/04/23 [phone]

## 2023-10-17 ENCOUNTER — Ambulatory Visit

## 2023-10-18 ENCOUNTER — Ambulatory Visit
Admission: RE | Admit: 2023-10-18 | Discharge: 2023-10-18 | Disposition: A | Discharge status: Home / Self Care | Source: Ambulatory Visit | Attending: Emergency Medicine | Admitting: Emergency Medicine

## 2023-10-18 VITALS — BP 137/90 | HR 71 | Temp 98.1°F | Resp 16

## 2023-10-18 DIAGNOSIS — M62830 Muscle spasm of back: Secondary | ICD-10-CM | POA: Diagnosis not present

## 2023-10-18 DIAGNOSIS — M545 Low back pain, unspecified: Secondary | ICD-10-CM

## 2023-10-18 LAB — URINALYSIS, W/ REFLEX TO CULTURE (INFECTION SUSPECTED)
Bilirubin Urine: NEGATIVE
Glucose, UA: NEGATIVE mg/dL
Hgb urine dipstick: NEGATIVE
Ketones, ur: NEGATIVE mg/dL
Leukocytes,Ua: NEGATIVE
Nitrite: NEGATIVE
Protein, ur: NEGATIVE mg/dL
Specific Gravity, Urine: 1.025 (ref 1.005–1.030)
pH: 7 (ref 5.0–8.0)

## 2023-10-18 MED ORDER — CYCLOBENZAPRINE HCL 5 MG PO TABS: 5.0000 mg | ORAL_TABLET | Freq: Two times a day (BID) | ORAL | 0 refills | Status: AC | PRN

## 2023-10-18 NOTE — ED Triage Notes (Signed)
 Lower back pain x 4 days. Taking tylenol .

## 2023-10-18 NOTE — Discharge Instructions (Addendum)
 Your urine was negative for UTI ,take home meds as directed. May take muscle relaxer(flexeril  as prescribed) May use heat or ice to back for comfort 20 min 3 x daily.  May use over the counter lidocaine  patch or biofreeze for pain, tylenol  as label directed.  Please follow up with PCP, may need to referral to physical therapy for further back pain management.  Go immediately to nearest ER or call 9-1-1 for loss of bowel and bladder,loss of function, saddle numbness, etc.  Avoid lifting,turning,bending as this will aggravate your back

## 2023-10-18 NOTE — ED Provider Notes (Signed)
 [facility]    CSN: [id number] Arrival date & time: 10/18/23  1029      History   Chief Complaint Chief Complaint  Patient presents with   Back Pain    Entered by patient    HPI Kristen Little is a 28 y.o. female.   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  Patient denies any dysuria or vaginal discharge, loss of bowel or bladder, saddle numbness, or loss of function.  Patient denies any known trauma or injury.  Patient states she recently got over COVID.  LMP 09/30/2003(IUD)  The history is provided by the patient. No language interpreter was used.    Past Medical History:  Diagnosis Date   Allergy 1997   Anxiety 02/27   Depression 04/11/21   Diet controlled gestational diabetes mellitus 02/18/2021   Irritable bowel syndrome (IBS)    Medical history non-contributory    Ovarian cyst    Vaginal Pap smear, abnormal     Patient Active Problem List   Diagnosis Date Noted   Acute bilateral low back pain without sciatica 10/18/2023   Muscle spasm of back 10/18/2023   Anxiety and depression 09/24/2023   History of abnormal cervical Pap smear 04/07/2021   Diet controlled gestational diabetes mellitus 02/18/2021   Irregular uterine contractions 01/10/2021   Migraines 07/04/2019    Past Surgical History:  Procedure Laterality Date   ADENOIDECTOMY W/ MYRINGOTOMY Bilateral    done as a child   CHOLECYSTECTOMY  05/27/2019   ERCP     TONSILLECTOMY      OB History     Gravida  4   Para  4   Term  4   Preterm  0   AB  0   Living  4      SAB  0   IAB  0   Ectopic  0   Multiple  0   Live Births  4        Obstetric Comments  Observed for high BP- resolved, Vaginal bleeding- query post v/e, decreased Fetal movement- resolved, right side Ovarian cyst- resolved          Home Medications     Prior to Admission medications   Medication Sig Start Date End Date Taking? Authorizing Provider  buPROPion  (WELLBUTRIN  XL) 300 MG 24 hr tablet Take 1 tablet (300 mg total) by mouth daily. 09/24/23  Yes Kotturi, Vinay K, MD  cyclobenzaprine  (FLEXERIL ) 5 MG tablet Take 1 tablet (5 mg total) by mouth every 12 (twelve) hours as needed for up to 5 days for muscle spasms. 10/18/23 10/23/23 Yes Shauna Bodkins, NP  PARAGARD  INTRAUTERINE COPPER  IU by Intrauterine route. 04/07/21 04/08/31  Lynda Bradley, CNM    Family History Family History  Problem Relation Age of Onset   Heart disease Maternal Grandmother    Cancer Maternal Grandmother    Diabetes Maternal Grandmother    Obesity Maternal Grandmother    Cancer Maternal Grandfather    Hepatitis C Paternal Grandmother    Anxiety disorder Mother    Depression Mother    Drug abuse Father    Miscarriages / [place] Sister     Social History Social History   Tobacco Use   Smoking status: Never   Smokeless tobacco: Never   Tobacco comments:    Denies secondhand smoke.  Vaping Use  Vaping status: Never Used  Substance Use Topics   Alcohol use: No   Drug use: No     Allergies   Azithromycin  and Penicillins   Review of Systems Review of Systems  Constitutional:  Negative for fever.  Genitourinary:  Negative for dysuria and vaginal discharge.  Musculoskeletal:  Positive for back pain and myalgias.  All other systems reviewed and are negative.    Physical Exam Triage Vital Signs ED Triage Vitals [10/18/23 1049]  Encounter Vitals Group     BP      Girls Systolic BP Percentile      Girls Diastolic BP Percentile      Boys Systolic BP Percentile      Boys Diastolic BP Percentile      Pulse      Resp      Temp      Temp src      SpO2      Weight      Height      Head Circumference      Peak Flow      Pain Score 4     Pain Loc      Pain Education      Exclude from Growth Chart    No data found.  Updated Vital  Signs BP (!) 137/90 (BP Location: Left Arm)   Pulse 71   Temp 98.1 F (36.7 C) (Oral)   Resp 16   LMP 09/30/2023 (Approximate)   SpO2 99%   Visual Acuity Right Eye Distance:   Left Eye Distance:   Bilateral Distance:    Right Eye Near:   Left Eye Near:    Bilateral Near:     Physical Exam Constitutional:      Appearance: She is well-developed and well-groomed.  Cardiovascular:     Rate and Rhythm: Normal rate.  Pulmonary:     Effort: Pulmonary effort is normal.  Musculoskeletal:     Lumbar back: Spasms and tenderness present. No swelling, edema, deformity, signs of trauma, lacerations or bony tenderness. Normal range of motion. Negative right straight leg raise test and negative left straight leg raise test. No scoliosis.       Back:     Comments: -CVAT bilaterally  Neurological:     General: No focal deficit present.     Mental Status: She is alert and oriented to person, place, and time.     GCS: GCS eye subscore is 4. GCS verbal subscore is 5. GCS motor subscore is 6.     Comments: Patient has equal dorsi flexion/ extension bilaterally full, no foot drop  Psychiatric:        Attention and Perception: Attention normal.        Mood and Affect: Mood normal.        Speech: Speech normal.        Behavior: Behavior is cooperative.      UC Treatments / Results  Labs (all labs ordered are listed, but only abnormal results are displayed) Labs Reviewed  URINALYSIS, W/ REFLEX TO CULTURE (INFECTION SUSPECTED) - Abnormal; Notable for the following components:      Result Value   Bacteria, UA RARE (*)    All other components within normal limits    EKG   Radiology No results found.  Procedures Procedures (including critical care time)  Medications Ordered in UC Medications - No data to display  Initial Impression / Assessment and Plan / UC Course  I have reviewed the triage vital  signs and the nursing notes.  Pertinent labs & imaging results that were  available during my care of the patient were reviewed by me and considered in my medical decision making (see chart for details).  Clinical Course as of 10/18/23 1228  Thu Oct 18, 2023  1112 Patient requesting UA to be sure she does not have a UTI [JD]  1135 UA is negative [JD]    Clinical Course User Index [JD] Jeremias Broyhill, Rilla, NP   Discussed exam findings and plan of care with patient, flexeril  scripted for muscle spasm, work note given, UA negative, strict go to ER precautions given.   Patient verbalized understanding to this provider.  Ddx: Acute low back pain without sciatica, muscle spasm of back Final Clinical Impressions(s) / UC Diagnoses   Final diagnoses:  Acute bilateral low back pain without sciatica  Muscle spasm of back     Discharge Instructions      Your urine was negative for UTI ,take home meds as directed. May take muscle relaxer(flexeril  as prescribed) May use heat or ice to back for comfort 20 min 3 x daily.  May use over the counter lidocaine  patch or biofreeze for pain, tylenol  as label directed.  Please follow up with PCP, may need to referral to physical therapy for further back pain management.  Go immediately to nearest ER or call 9-1-1 for loss of bowel and bladder,loss of function, saddle numbness, etc.  Avoid lifting,turning,bending as this will aggravate your back     ED Prescriptions     Medication Sig Dispense Auth. Provider   cyclobenzaprine  (FLEXERIL ) 5 MG tablet Take 1 tablet (5 mg total) by mouth every 12 (twelve) hours as needed for up to 5 days for muscle spasms. 10 tablet Maylynn Orzechowski, Rilla, NP      PDMP not reviewed this encounter.   Aminta Rilla, NP 10/18/23 1228

## 2023-10-19 ENCOUNTER — Telehealth: Payer: Self-pay | Admitting: Family Medicine

## 2023-10-19 NOTE — Telephone Encounter (Signed)
 Returned call to patient regarding her questions about possible urinary tract infection.  Patient questioning whether she actually has a UTI after being told she does not have a UTI yesterday for her back pain and nausea.  She reviewed her MyChart results and is concerned that she actually has a UTI.  Reviewed the findings with the patient.  She does not have a urinary tract infection.  Explained why to the patient.  She accepts this.  No medications needed at this time.  Thanked provider for calling.  Caprice Porteous, DO

## 2023-11-26 ENCOUNTER — Ambulatory Visit: Admitting: Family Medicine

## 2023-11-27 ENCOUNTER — Encounter: Payer: Self-pay | Admitting: Family Medicine

## 2023-11-27 ENCOUNTER — Ambulatory Visit: Admitting: Family Medicine

## 2023-11-30 ENCOUNTER — Other Ambulatory Visit: Payer: Self-pay | Admitting: Family Medicine

## 2023-11-30 DIAGNOSIS — F32A Depression, unspecified: Secondary | ICD-10-CM

## 2023-12-03 ENCOUNTER — Other Ambulatory Visit: Payer: Self-pay

## 2023-12-03 NOTE — Telephone Encounter (Signed)
 Requested medications are due for refill today.  yes  Requested medications are on the active medications list.  yes  Last refill. 09/24/2023 #30 1 rf  Future visit scheduled.   no  Notes to clinic.  New medication to this pt. Pt missed follow up appt.    Requested Prescriptions  Pending Prescriptions Disp Refills   buPROPion  (WELLBUTRIN  XL) 300 MG 24 hr tablet [Pharmacy Med Name: buPROPion  HCl ER (XL) 300 MG Oral Tablet Extended Release 24 Hour] 30 tablet 0    Sig: Take 1 tablet by mouth once daily     Psychiatry: Antidepressants - bupropion  Failed - 12/03/2023  2:52 PM      Failed - Last BP in normal range    BP Readings from Last 1 Encounters:  10/18/23 (!) 137/90         Passed - Cr in normal range and within 360 days    Creatinine, Ser  Date Value Ref Range Status  07/29/2023 0.85 0.44 - 1.00 mg/dL Final         Passed - AST in normal range and within 360 days    AST  Date Value Ref Range Status  07/29/2023 17 15 - 41 U/L Final         Passed - ALT in normal range and within 360 days    ALT  Date Value Ref Range Status  07/29/2023 16 0 - 44 U/L Final         Passed - Completed PHQ-2 or PHQ-9 in the last 360 days      Passed - Valid encounter within last 6 months    Recent Outpatient Visits           2 months ago Anxiety and depression   [facility] Kotturi, Vinay K, MD   3 months ago Anxiety and depression   [facility] at [facility], Vinay K, MD
